# Patient Record
Sex: Male | Born: 1970
Health system: Southern US, Community
[De-identification: ages and names within clinical notes are randomized; demographics above are authoritative.]

## PROBLEM LIST (undated history)

## (undated) DIAGNOSIS — F419 Anxiety disorder, unspecified: Secondary | ICD-10-CM

## (undated) DIAGNOSIS — J45909 Unspecified asthma, uncomplicated: Secondary | ICD-10-CM

## (undated) DIAGNOSIS — I10 Essential (primary) hypertension: Secondary | ICD-10-CM

## (undated) DIAGNOSIS — M1990 Unspecified osteoarthritis, unspecified site: Secondary | ICD-10-CM

## (undated) DIAGNOSIS — E785 Hyperlipidemia, unspecified: Secondary | ICD-10-CM

## (undated) DIAGNOSIS — K219 Gastro-esophageal reflux disease without esophagitis: Secondary | ICD-10-CM

## (undated) DIAGNOSIS — K649 Unspecified hemorrhoids: Secondary | ICD-10-CM

## (undated) DIAGNOSIS — Z87442 Personal history of urinary calculi: Secondary | ICD-10-CM

## (undated) DIAGNOSIS — M109 Gout, unspecified: Secondary | ICD-10-CM

## (undated) DIAGNOSIS — E119 Type 2 diabetes mellitus without complications: Secondary | ICD-10-CM

## (undated) HISTORY — DX: Anxiety disorder, unspecified: F41.9

## (undated) HISTORY — DX: Unspecified osteoarthritis, unspecified site: M19.90

## (undated) HISTORY — DX: Hyperlipidemia, unspecified: E78.5

## (undated) HISTORY — DX: Unspecified hemorrhoids: K64.9

## (undated) HISTORY — PX: HERNIA REPAIR: SHX51

---

## 2000-02-28 ENCOUNTER — Ambulatory Visit (HOSPITAL_COMMUNITY): Admission: RE | Admit: 2000-02-28 | Discharge: 2000-02-28 | Payer: Self-pay | Admitting: Orthopedic Surgery

## 2000-02-28 ENCOUNTER — Encounter: Payer: Self-pay | Admitting: Orthopedic Surgery

## 2008-08-05 HISTORY — PX: COLONOSCOPY: SHX174

## 2014-05-17 DIAGNOSIS — M7022 Olecranon bursitis, left elbow: Secondary | ICD-10-CM | POA: Insufficient documentation

## 2014-05-17 HISTORY — DX: Olecranon bursitis, left elbow: M70.22

## 2015-08-10 HISTORY — PX: COLONOSCOPY: SHX174

## 2017-04-29 DIAGNOSIS — N200 Calculus of kidney: Secondary | ICD-10-CM

## 2017-04-29 HISTORY — DX: Calculus of kidney: N20.0

## 2017-11-27 DIAGNOSIS — N201 Calculus of ureter: Secondary | ICD-10-CM | POA: Insufficient documentation

## 2017-11-27 HISTORY — DX: Calculus of ureter: N20.1

## 2018-01-23 DIAGNOSIS — M659 Synovitis and tenosynovitis, unspecified: Secondary | ICD-10-CM

## 2018-01-23 DIAGNOSIS — M65929 Unspecified synovitis and tenosynovitis, unspecified upper arm: Secondary | ICD-10-CM | POA: Insufficient documentation

## 2018-01-23 HISTORY — DX: Unspecified synovitis and tenosynovitis, unspecified upper arm: M65.929

## 2018-03-09 DIAGNOSIS — M10022 Idiopathic gout, left elbow: Secondary | ICD-10-CM

## 2018-03-09 HISTORY — DX: Idiopathic gout, left elbow: M10.022

## 2019-03-22 DIAGNOSIS — Z8616 Personal history of COVID-19: Secondary | ICD-10-CM | POA: Insufficient documentation

## 2019-03-22 HISTORY — DX: Personal history of COVID-19: Z86.16

## 2019-03-28 ENCOUNTER — Emergency Department (HOSPITAL_BASED_OUTPATIENT_CLINIC_OR_DEPARTMENT_OTHER)
Admission: EM | Admit: 2019-03-28 | Discharge: 2019-03-28 | Disposition: A | Discharge status: Home / Self Care | Payer: 59 | Attending: Emergency Medicine | Admitting: Emergency Medicine

## 2019-03-28 ENCOUNTER — Other Ambulatory Visit: Payer: Self-pay

## 2019-03-28 ENCOUNTER — Emergency Department (HOSPITAL_BASED_OUTPATIENT_CLINIC_OR_DEPARTMENT_OTHER): Payer: 59

## 2019-03-28 ENCOUNTER — Encounter (HOSPITAL_BASED_OUTPATIENT_CLINIC_OR_DEPARTMENT_OTHER): Payer: Self-pay | Admitting: *Deleted

## 2019-03-28 DIAGNOSIS — J9801 Acute bronchospasm: Secondary | ICD-10-CM | POA: Diagnosis not present

## 2019-03-28 DIAGNOSIS — E119 Type 2 diabetes mellitus without complications: Secondary | ICD-10-CM | POA: Insufficient documentation

## 2019-03-28 DIAGNOSIS — I10 Essential (primary) hypertension: Secondary | ICD-10-CM | POA: Diagnosis not present

## 2019-03-28 DIAGNOSIS — Z79899 Other long term (current) drug therapy: Secondary | ICD-10-CM | POA: Insufficient documentation

## 2019-03-28 DIAGNOSIS — U071 COVID-19: Secondary | ICD-10-CM | POA: Diagnosis present

## 2019-03-28 DIAGNOSIS — Z7982 Long term (current) use of aspirin: Secondary | ICD-10-CM | POA: Diagnosis not present

## 2019-03-28 HISTORY — DX: Unspecified asthma, uncomplicated: J45.909

## 2019-03-28 HISTORY — DX: Essential (primary) hypertension: I10

## 2019-03-28 HISTORY — DX: Type 2 diabetes mellitus without complications: E11.9

## 2019-03-28 LAB — CBC WITH DIFFERENTIAL/PLATELET
Abs Immature Granulocytes: 0.03 10*3/uL (ref 0.00–0.07)
Basophils Absolute: 0 10*3/uL (ref 0.0–0.1)
Basophils Relative: 0 %
Eosinophils Absolute: 0.1 10*3/uL (ref 0.0–0.5)
Eosinophils Relative: 1 %
HCT: 35.5 % — ABNORMAL LOW (ref 39.0–52.0)
Hemoglobin: 11.3 g/dL — ABNORMAL LOW (ref 13.0–17.0)
Immature Granulocytes: 0 %
Lymphocytes Relative: 14 %
Lymphs Abs: 1 10*3/uL (ref 0.7–4.0)
MCH: 27.7 pg (ref 26.0–34.0)
MCHC: 31.8 g/dL (ref 30.0–36.0)
MCV: 87 fL (ref 80.0–100.0)
Monocytes Absolute: 0.3 10*3/uL (ref 0.1–1.0)
Monocytes Relative: 5 %
Neutro Abs: 5.6 10*3/uL (ref 1.7–7.7)
Neutrophils Relative %: 80 %
Platelets: 385 10*3/uL (ref 150–400)
RBC: 4.08 MIL/uL — ABNORMAL LOW (ref 4.22–5.81)
RDW: 12.5 % (ref 11.5–15.5)
WBC: 7 10*3/uL (ref 4.0–10.5)
nRBC: 0 % (ref 0.0–0.2)

## 2019-03-28 LAB — BASIC METABOLIC PANEL
Anion gap: 9 (ref 5–15)
BUN: 24 mg/dL — ABNORMAL HIGH (ref 6–20)
CO2: 23 mmol/L (ref 22–32)
Calcium: 8.9 mg/dL (ref 8.9–10.3)
Chloride: 105 mmol/L (ref 98–111)
Creatinine, Ser: 1.15 mg/dL (ref 0.61–1.24)
GFR calc Af Amer: >60 mL/min (ref >60)
GFR calc non Af Amer: >60 mL/min (ref >60)
Glucose, Bld: 240 mg/dL — ABNORMAL HIGH (ref 70–99)
Potassium: 4.6 mmol/L (ref 3.5–5.1)
Sodium: 137 mmol/L (ref 135–145)

## 2019-03-28 LAB — CBG MONITORING, ED: Glucose-Capillary: 252 mg/dL — ABNORMAL HIGH (ref 70–99)

## 2019-03-28 MED ORDER — BENZONATATE 100 MG PO CAPS: 100.0000 mg | ORAL_CAPSULE | Freq: Three times a day (TID) | ORAL | 0 refills | Status: DC | PRN

## 2019-03-28 MED ORDER — SODIUM CHLORIDE 0.9 % IV BOLUS
500.0000 mL | Freq: Once | INTRAVENOUS | Status: AC
  Administered 2019-03-28: 500 mL via INTRAVENOUS

## 2019-03-28 MED ORDER — ALBUTEROL SULFATE HFA 108 (90 BASE) MCG/ACT IN AERS
2.0000 | INHALATION_SPRAY | Freq: Once | RESPIRATORY_TRACT | Status: AC
  Administered 2019-03-28: 2 via RESPIRATORY_TRACT
  Filled 2019-03-28: qty 6.7

## 2019-03-28 MED ORDER — AEROCHAMBER PLUS FLO-VU MEDIUM MISC
1.0000 | Freq: Once | Status: AC
  Administered 2019-03-28: 1
  Filled 2019-03-28: qty 1

## 2019-03-28 NOTE — ED Notes (Signed)
Pt ambulated in room, pulse O2 maintained at 97-99% while ambulating.

## 2019-03-28 NOTE — ED Provider Notes (Signed)
[facility] [facility] EMERGENCY DEPARTMENT Provider Note   CSN: [id number] Arrival date & time: 03/28/19  1343     History Chief Complaint  Patient presents with  . Shortness of Breath    Covid +    Joshua Skinner is a 48 y.o. male who presents emergency department with chief complaint of shortness of breath.  The patient tested positive for coronavirus on 03/22/2019.  He has been symptomatic since that had.  His mother is also positive for coronavirus.  He states that during the day his oxygen saturation is only in the low 90s however at night he desaturates into the 80s and has to be on at least 2 L of oxygen to keep his oxygen saturations high.  He has been using his mother's oxygen tank.  He states he has constant coughing, weight loss, poor appetite, headaches, body aches and chills.  He denies vomiting or diarrhea.  Patient denies orthopnea, PND or history of obstructive sleep apnea.  HPI     Past Medical History:  Diagnosis Date  . Asthma   . Diabetes mellitus without complication (HCC)   . Hypertension     There are no problems to display for this patient.   Past Surgical History:  Procedure Laterality Date  . HERNIA REPAIR         No family history on file.  Social History   Tobacco Use  . Smoking status: Never Smoker  . Smokeless tobacco: Never Used  Substance Use Topics  . Alcohol use: Never  . Drug use: Never    Home Medications Prior to Admission medications   Medication Sig Start Date End Date Taking? Authorizing Provider  aspirin 81 MG chewable tablet Chew 81 mg by mouth daily.   Yes [provider]  allopurinol (ZYLOPRIM) 300 MG tablet Take 300 mg by mouth daily. 02/08/19   [provider]  atorvastatin (LIPITOR) 40 MG tablet Take 40 mg by mouth daily. 03/04/19   [provider]  benzonatate (TESSALON PERLES) 100 MG capsule Take 1 capsule (100 mg total) by mouth 3 (three) times daily as needed for cough (cough). 03/28/19    Ilisa Hayworth, Cammy Copa, PA-C  lisinopril (ZESTRIL) 20 MG tablet Take 20 mg by mouth daily. 03/27/19   [provider]  metFORMIN (GLUCOPHAGE) 1000 MG tablet Take 1,000 mg by mouth 2 (two) times daily. 03/27/19   [provider]  tamsulosin (FLOMAX) 0.4 MG CAPS capsule Take 0.4 mg by mouth at bedtime. 12/05/18   [provider]    Allergies    Patient has no known allergies.  Review of Systems   Review of Systems Ten systems reviewed and are negative for acute change, except as noted in the HPI.   Physical Exam Updated Vital Signs BP (!) 145/98 (BP Location: Right Arm)   Pulse 90   Temp 97.8 F (36.6 C) (Oral)   Resp 18   Ht 5\' 7"  (1.702 m)   Wt 86.2 kg   SpO2 98%   BMI 29.76 kg/m   Physical Exam Vitals and nursing note reviewed.  Constitutional:      General: He is not in acute distress.    Appearance: He is well-developed. He is not diaphoretic.  HENT:     Head: Normocephalic and atraumatic.  Eyes:     General: No scleral icterus.    Conjunctiva/sclera: Conjunctivae normal.  Cardiovascular:     Rate and Rhythm: Normal rate and regular rhythm.     Heart sounds: Normal heart  sounds.  Pulmonary:     Effort: Pulmonary effort is normal. No respiratory distress.     Breath sounds: Wheezing present.  Abdominal:     Palpations: Abdomen is soft.     Tenderness: There is no abdominal tenderness.  Musculoskeletal:     Cervical back: Normal range of motion and neck supple.  Skin:    General: Skin is warm and dry.  Neurological:     Mental Status: He is alert.  Psychiatric:        Behavior: Behavior normal.     ED Results / Procedures / Treatments   Labs (all labs ordered are listed, but only abnormal results are displayed) Labs Reviewed  BASIC METABOLIC PANEL - Abnormal; Notable for the following components:      Result Value   Glucose, Bld 240 (*)    BUN 24 (*)    All other components within normal limits  CBC WITH DIFFERENTIAL/PLATELET -  Abnormal; Notable for the following components:   RBC 4.08 (*)    Hemoglobin 11.3 (*)    HCT 35.5 (*)    All other components within normal limits  CBG MONITORING, ED - Abnormal; Notable for the following components:   Glucose-Capillary 252 (*)    All other components within normal limits    EKG None  Radiology DG Chest Port 1 View  Result Date: 03/28/2019 CLINICAL DATA:  Cough with shortness of breath. Positive COVID-19 test 03/22/2019. Abdominal pain. Hyperglycemia on prednisone. History of diabetes. EXAM: PORTABLE CHEST 1 VIEW COMPARISON:  None. FINDINGS: 1614 hours. The heart size and mediastinal contours are normal. Small nodular density projects over the right 5th anterior rib space, possibly an asymmetric nipple shadow. Small pulmonary nodule difficult to exclude. The lungs are otherwise clear. There is no confluent airspace opacity, edema, pleural effusion or pneumothorax. The bones appear unremarkable. IMPRESSION: 1. No acute cardiopulmonary process. 2. Possible small right basilar pulmonary nodule versus asymmetric nipple shadow. Suggest repeat PA view with nipple markers (unless CT is planned for other reasons). Electronically Signed   By: Carey BullocksWilliam  Veazey M.D.   On: 03/28/2019 16:34    Procedures Procedures (including critical care time)  Medications Ordered in ED Medications  AeroChamber Plus Flo-Vu Medium MISC 1 each (1 each Other Given 03/28/19 1649)  albuterol (VENTOLIN HFA) 108 (90 Base) MCG/ACT inhaler 2 puff (2 puffs Inhalation Given 03/28/19 1649)  sodium chloride 0.9 % bolus 500 mL ( Intravenous Stopped 03/28/19 1710)    ED Course  I have reviewed the triage vital signs and the nursing notes.  Pertinent labs & imaging results that were available during my care of the patient were reviewed by me and considered in my medical decision making (see chart for details).    MDM Rules/Calculators/A&P                      48 y/o male with known Covid-19.  Patient is  complaining of worsening shortness of breath and hypoxia at night on home monitor.  I have reviewed the patient's lab which shows elevated blood glucose, mildly elevated BUN of insignificant value.  He has slightly low hemoglobin in the setting of poor oral intake and COVID-19.  I personally reviewed the patient's chest x-ray which shows no evidence of infiltrates on my interpretation.  Patient was given albuterol inhaler with improvement in his cough.  He is currently on oral steroids.  He had no reproducible hypoxia at any point during the visit.  He was  ambulated without hypoxia here in the emergency department.  Feel that this is likely sensation of bronchospasm and he will be treated with albuterol.  Perhaps there is some poor calibration on the patient's pulse ox at home.  Advised patient that he should return for any severe worsening in his shortness of breath.  Patient appears appropriate for discharge at this time with COVID-19 home monitoring.   Final Clinical Impression(s) / ED Diagnoses Final diagnoses:  COVID-19 virus infection  Bronchospasm    Rx / DC Orders ED Discharge Orders         Ordered    benzonatate (TESSALON PERLES) 100 MG capsule  3 times daily PRN,   Status:  Discontinued     03/28/19 1747    benzonatate (TESSALON PERLES) 100 MG capsule  3 times daily PRN     03/28/19 1748           Margarita Mail, PA-C 03/28/19 2244    Long, W[facility], MD 03/29/19 1311

## 2019-03-28 NOTE — ED Triage Notes (Signed)
Pt reports Covid Sx since 12/10. He had a + covid test on 12/21. He reports his home pulse oximeter was in the 80s last night. He states he had to use his mother's oxygen tank last night to get his sats to 95%. C/o abd pain and feeling cold/hot. He is on prednisone and states his sugar has been running in the 200-300s

## 2019-03-28 NOTE — Discharge Instructions (Addendum)
We were unable to document any hypoxia here in the ER. It is possible that your pulse is not correctly calibrated and is reading low. However, you may need to contact your primary care doctor to order supplemental oxygen. You do not have any evidence of pnuemonia on your xray. There was a small questionable pulmonary nodule vs. A nipple shadow on the film. You will need to get a repeat chest xray in 3 months to reevaluate and I have given you a hand out to explain this diagnosis. You also appear to be having some bronchospasm- a form of reactive asthma- which is likely why you are feeling sob. Please use the inhaler (1-2 puffs every 4-6 hours) to help reduce your symptoms. I am prescribing an cough suppressant. Return for any new or worsening symptoms.

## 2019-06-25 HISTORY — PX: ESOPHAGOGASTRODUODENOSCOPY: SHX1529

## 2020-01-28 ENCOUNTER — Other Ambulatory Visit: Payer: Self-pay

## 2020-01-28 ENCOUNTER — Encounter: Payer: Self-pay | Admitting: Medical

## 2020-01-28 ENCOUNTER — Ambulatory Visit (INDEPENDENT_AMBULATORY_CARE_PROVIDER_SITE_OTHER): Payer: 59 | Admitting: Medical

## 2020-01-28 VITALS — BP 165/90 | HR 86 | Resp 18 | Ht 67.0 in | Wt 209.0 lb

## 2020-01-28 DIAGNOSIS — I10 Essential (primary) hypertension: Secondary | ICD-10-CM | POA: Diagnosis not present

## 2020-01-28 DIAGNOSIS — M109 Gout, unspecified: Secondary | ICD-10-CM | POA: Diagnosis not present

## 2020-01-28 DIAGNOSIS — E785 Hyperlipidemia, unspecified: Secondary | ICD-10-CM

## 2020-01-28 DIAGNOSIS — J029 Acute pharyngitis, unspecified: Secondary | ICD-10-CM

## 2020-01-28 DIAGNOSIS — E119 Type 2 diabetes mellitus without complications: Secondary | ICD-10-CM

## 2020-01-28 DIAGNOSIS — Z125 Encounter for screening for malignant neoplasm of prostate: Secondary | ICD-10-CM

## 2020-01-28 DIAGNOSIS — R350 Frequency of micturition: Secondary | ICD-10-CM

## 2020-01-28 DIAGNOSIS — N401 Enlarged prostate with lower urinary tract symptoms: Secondary | ICD-10-CM

## 2020-01-28 LAB — POCT RAPID STREP A (OFFICE): Rapid Strep A Screen: NEGATIVE

## 2020-01-28 MED ORDER — AMLODIPINE BESYLATE 5 MG PO TABS: 5.0000 mg | ORAL_TABLET | Freq: Every day | ORAL | 3 refills | Status: DC

## 2020-01-28 MED ORDER — METFORMIN HCL 1000 MG PO TABS: 1000.0000 mg | ORAL_TABLET | Freq: Two times a day (BID) | ORAL | 11 refills | Status: DC

## 2020-01-28 MED ORDER — FLUTICASONE PROPIONATE 50 MCG/ACT NA SUSP: 2.0000 | Freq: Every day | NASAL | 1 refills | Status: DC

## 2020-01-28 MED ORDER — AZITHROMYCIN 250 MG PO TABS: ORAL_TABLET | ORAL | 0 refills | Status: DC

## 2020-01-28 NOTE — Patient Instructions (Signed)
You do have history of hypertension and your blood pressure is high today and high when you check it as well. Continue lisinopril and will add amlodipine 5 mg daily.  History of hyperlipidemia. Will check metabolic panel and lipid panel today. We will see if he needs adjustment on lipid management.  History of diabetes with described A1c in the 7 range on last check. Also you daily blood sugar readings found on the high side. Continue Metformin and will follow A1c result today. May need to add additional medication. Also want to make you aware that we do have endocrinologist in our office.  For history of gout will get uric acid today.  For BPH continue Flomax. Also ordered screening PSA today.  Sore throat for the last 4 days. Moderate redness on exam and slightly enlarged tender lymph node in submandibular area. Rapid strep was negative but decided to go ahead and get a azithromycin 5-day antibiotic. Also recent mild nasal congestion so prescribed Flonase and event some of sore throat postnasal drainage related to allergies.  Follow-up in 2 weeks for blood pressure check or as needed.

## 2020-01-28 NOTE — Progress Notes (Signed)
Subjective:    Patient ID: Joshua Skinner, male    DOB: [birth year], 49 y.o.   MRN: [id number]  HPI  Pt in for first time.  Pt works as Naval architect. Does not exercise. Admits not best diet. Coca cola about once a day. Married.  Pt has htn, high cholesterol and is diabetic.  Pt has htn and is on lisinopril  For high cholesterol and is on atorvastatin.  For diabetes he is on metformin.(ozempic and trulicity gave him diarrhea)  Pt has bph. He is on flomax.  Pt declined flu vaccine.  Also has st since Monday. Has pnd. Pain on swollowing.    Review of Systems  Constitutional: Negative for chills and fatigue.  HENT: Positive for postnasal drip and sore throat. Negative for sinus pain.   Respiratory: Negative for cough, chest tightness, shortness of breath and wheezing.   Cardiovascular: Negative for chest pain and palpitations.  Gastrointestinal: Negative for abdominal pain.  Genitourinary: Negative for dysuria.  Musculoskeletal: Negative for back pain and myalgias.  Skin: Negative for rash.  Neurological: Negative for dizziness and headaches.  Hematological: Negative for adenopathy.  Psychiatric/Behavioral: Negative for behavioral problems and sleep disturbance. The patient is not nervous/anxious.      Past Medical History:  Diagnosis Date  . Asthma   . Diabetes mellitus without complication (HCC)   . Hypertension      Social History   Socioeconomic History  . Marital status: Married    Spouse name: Not on file  . Number of children: Not on file  . Years of education: Not on file  . Highest education level: Not on file  Occupational History  . Not on file  Tobacco Use  . Smoking status: Never Smoker  . Smokeless tobacco: Never Used  Vaping Use  . Vaping Use: Never used  Substance and Sexual Activity  . Alcohol use: Never  . Drug use: Never  . Sexual activity: Not on file  Other Topics Concern  . Not on file  Social History Narrative  . Not on file    Social Determinants of Health   Financial Resource Strain:   . Difficulty of Paying Living Expenses: Not on file  Food Insecurity:   . Worried About Programme researcher, broadcasting/film/video in the Last Year: Not on file  . Ran Out of Food in the Last Year: Not on file  Transportation Needs:   . Lack of Transportation (Medical): Not on file  . Lack of Transportation (Non-Medical): Not on file  Physical Activity:   . Days of Exercise per Week: Not on file  . Minutes of Exercise per Session: Not on file  Stress:   . Feeling of Stress : Not on file  Social Connections:   . Frequency of Communication with Friends and Family: Not on file  . Frequency of Social Gatherings with Friends and Family: Not on file  . Attends Religious Services: Not on file  . Active Member of Clubs or Organizations: Not on file  . Attends Banker Meetings: Not on file  . Marital Status: Not on file  Intimate Partner Violence:   . Fear of Current or Ex-Partner: Not on file  . Emotionally Abused: Not on file  . Physically Abused: Not on file  . Sexually Abused: Not on file    Past Surgical History:  Procedure Laterality Date  . HERNIA REPAIR      No family history on file.  No Known Allergies  Current Outpatient Medications  on File Prior to Visit  Medication Sig Dispense Refill  . allopurinol (ZYLOPRIM) 300 MG tablet Take 300 mg by mouth daily.    Marland Kitchen aspirin 81 MG chewable tablet Chew 81 mg by mouth daily.    Marland Kitchen atorvastatin (LIPITOR) 40 MG tablet Take 40 mg by mouth daily.    Marland Kitchen lisinopril (ZESTRIL) 20 MG tablet Take 20 mg by mouth daily.    . metFORMIN (GLUCOPHAGE) 1000 MG tablet Take 1,000 mg by mouth 2 (two) times daily.    . tamsulosin (FLOMAX) 0.4 MG CAPS capsule Take 0.4 mg by mouth at bedtime.    . benzonatate (TESSALON PERLES) 100 MG capsule Take 1 capsule (100 mg total) by mouth 3 (three) times daily as needed for cough (cough). (Patient not taking: Reported on 01/28/2020) 20 capsule 0   No  current facility-administered medications on file prior to visit.    BP (!) 164/94   Pulse 86   Resp 18   Ht 5\' 7"  (1.702 m)   Wt 209 lb (94.8 kg)   SpO2 98%   BMI 32.73 kg/m       Objective:   Physical Exam  General Mental Status- Alert. General Appearance- Not in acute distress.   Skin General: Color- Normal Color. Moisture- Normal Moisture.  Neck Carotid Arteries- Normal color. Moisture- Normal Moisture. No carotid bruits. No JVD.  Chest and Lung Exam Auscultation: Breath Sounds:-Normal.  Cardiovascular Auscultation:Rythm- Regular. Murmurs & Other Heart Sounds:Auscultation of the heart reveals- No Murmurs.  Abdomen Inspection:-Inspeection Normal. Palpation/Percussion:Note:No mass. Palpation and Percussion of the abdomen reveal- Non Tender, Non Distended + BS, no rebound or guarding.    Neurologic Cranial Nerve exam:- CN III-XII intact(No nystagmus), symmetric smile. Drift Test:- No drift. Romberg Exam:- Negative.  Heal to Toe Gait exam:-Normal. Finger to Nose:- Normal/Intact Strength:- 5/5 equal and symmetric strength both upper and lower extremities.      Assessment & Plan:  You do have history of hypertension and your blood pressure is high today and high when you check it as well. Continue lisinopril and will add amlodipine 5 mg daily.  History of hyperlipidemia. Will check metabolic panel and lipid panel today. We will see if he needs adjustment on lipid management.  History of diabetes with described A1c in the 7 range on last check. Also you daily blood sugar readings found on the high side. Continue Metformin and will follow A1c result today. May need to add additional medication. Also want to make you aware that we do have endocrinologist in our office.  For history of gout will get uric acid today.  For BPH continue Flomax. Also ordered screening PSA today.  Sore throat for the last 4 days. Moderate redness on exam and slightly enlarged tender  lymph node in submandibular area. Rapid strep was negative but decided to go ahead and get a azithromycin 5-day antibiotic. Also recent mild nasal congestion so prescribed Flonase and event some of sore throat postnasal drainage related to allergies.  Follow-up in 2 weeks for blood pressure check or as needed.  , PA-C

## 2020-01-29 ENCOUNTER — Telehealth: Payer: Self-pay | Admitting: Medical

## 2020-01-29 LAB — LIPID PANEL
Cholesterol: 116 mg/dL (ref <200)
HDL: 34 mg/dL — ABNORMAL LOW (ref >=40)
LDL Cholesterol (Calc): 42 mg/dL (calc)
Non-HDL Cholesterol (Calc): 82 mg/dL (calc) (ref <130)
Total CHOL/HDL Ratio: 3.4 (calc) (ref <5.0)
Triglycerides: 380 mg/dL — ABNORMAL HIGH (ref <150)

## 2020-01-29 LAB — COMPREHENSIVE METABOLIC PANEL
AG Ratio: 2.1 (calc) (ref 1.0–2.5)
ALT: 24 U/L (ref 9–46)
AST: 16 U/L (ref 10–40)
Albumin: 4.9 g/dL (ref 3.6–5.1)
Alkaline phosphatase (APISO): 46 U/L (ref 36–130)
BUN: 19 mg/dL (ref 7–25)
CO2: 24 mmol/L (ref 20–32)
Calcium: 9.7 mg/dL (ref 8.6–10.3)
Chloride: 103 mmol/L (ref 98–110)
Creat: 1.13 mg/dL (ref 0.60–1.35)
Globulin: 2.3 g/dL (calc) (ref 1.9–3.7)
Glucose, Bld: 122 mg/dL — ABNORMAL HIGH (ref 65–99)
Potassium: 4.2 mmol/L (ref 3.5–5.3)
Sodium: 140 mmol/L (ref 135–146)
Total Bilirubin: 0.5 mg/dL (ref 0.2–1.2)
Total Protein: 7.2 g/dL (ref 6.1–8.1)

## 2020-01-29 LAB — URIC ACID: Uric Acid, Serum: 5.7 mg/dL (ref 4.0–8.0)

## 2020-01-29 LAB — PSA: PSA: 0.41 ng/mL (ref <=4.0)

## 2020-01-29 LAB — HEMOGLOBIN A1C
Hgb A1c MFr Bld: 8.1 % of total Hgb — ABNORMAL HIGH (ref <5.7)
Mean Plasma Glucose: 186 (calc)
eAG (mmol/L): 10.3 (calc)

## 2020-01-29 MED ORDER — SITAGLIPTIN PHOSPHATE 50 MG PO TABS: 50.0000 mg | ORAL_TABLET | Freq: Every day | ORAL | 3 refills | Status: DC

## 2020-01-29 NOTE — Telephone Encounter (Signed)
Rx januvia sent to pt pharmacy. 

## 2020-02-11 ENCOUNTER — Ambulatory Visit: Payer: Self-pay | Admitting: Medical

## 2020-03-20 ENCOUNTER — Telehealth: Payer: Self-pay | Admitting: Medical

## 2020-03-20 MED ORDER — LISINOPRIL 20 MG PO TABS: 20.0000 mg | ORAL_TABLET | Freq: Every day | ORAL | 2 refills | Status: DC

## 2020-03-20 NOTE — Telephone Encounter (Signed)
Rx sent 

## 2020-03-20 NOTE — Telephone Encounter (Signed)
atorvastatin (LIPITOR) 40 MG tablet [[id number]]   [id number] - [place], [place] - [address]  [address], [place] [address]  Phone:  [phone] Fax:  [phone]

## 2020-04-10 ENCOUNTER — Ambulatory Visit: Payer: Self-pay | Admitting: Medical

## 2020-04-10 ENCOUNTER — Other Ambulatory Visit: Payer: Self-pay

## 2020-04-10 ENCOUNTER — Ambulatory Visit (INDEPENDENT_AMBULATORY_CARE_PROVIDER_SITE_OTHER): Payer: 59 | Admitting: Medical

## 2020-04-10 VITALS — BP 128/78 | HR 66 | Resp 18 | Ht 67.0 in | Wt 208.0 lb

## 2020-04-10 DIAGNOSIS — E119 Type 2 diabetes mellitus without complications: Secondary | ICD-10-CM

## 2020-04-10 DIAGNOSIS — R0789 Other chest pain: Secondary | ICD-10-CM | POA: Diagnosis not present

## 2020-04-10 DIAGNOSIS — I10 Essential (primary) hypertension: Secondary | ICD-10-CM | POA: Diagnosis not present

## 2020-04-10 DIAGNOSIS — E785 Hyperlipidemia, unspecified: Secondary | ICD-10-CM | POA: Diagnosis not present

## 2020-04-10 MED ORDER — ATORVASTATIN CALCIUM 40 MG PO TABS: 40.0000 mg | ORAL_TABLET | Freq: Every day | ORAL | 1 refills | Status: DC

## 2020-04-10 NOTE — Patient Instructions (Addendum)
Your blood pressure is well controlled today. Continue amlodipine daily.  Hx of high cholesterol. Continue atorvastatin.  For diabetes continue metformin and januvia. Get a1c.  Try to get daily exercise.  Follow up 3-6 months or as needed.  Future labs will be placed as future.  Will get chest xray and view xyphoid and surrounding areas/bones of ribs

## 2020-04-10 NOTE — Progress Notes (Signed)
Subjective:    Patient ID: Joshua Skinner, male    DOB: 1970-04-27, 50 y.o.   MRN: [id number]  HPI  Pt has htn. His bp is well controlled. No cardiac or neurologic signs or symptoms. He is on norvasc 5 mg daily.  High cholesterol and is on atorvastatin. Ran out over a month ago.   Hx of diabetes. He is on metformin and [place].  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    Review of Systems  Constitutional: Negative for chills, fatigue and fever.  Respiratory: Negative for cough, chest tightness, shortness of breath and wheezing.   Cardiovascular: Negative for chest pain and palpitations.  Gastrointestinal: Negative for abdominal pain, constipation, diarrhea, nausea and vomiting.  Genitourinary: Negative for dysuria, flank pain and frequency.  Musculoskeletal: Negative for back pain and myalgias.  Skin: Negative for rash.  Neurological: Negative for dizziness, speech difficulty, weakness and headaches.  Hematological: Negative for adenopathy. Does not bruise/bleed easily.  Psychiatric/Behavioral: Negative for behavioral problems, confusion, hallucinations and suicidal ideas. The patient is not nervous/anxious.      Past Medical History:  Diagnosis Date  . Asthma   . Diabetes mellitus without complication (HCC)   . Hyperlipidemia   . Hypertension      Social History   Socioeconomic History  . Marital status: Married    Spouse name: Not on file  . Number of children: Not on file  . Years of education: Not on file  . Highest education level: Not on file  Occupational History  . Not on file  Tobacco Use  . Smoking status: Never Smoker  . Smokeless tobacco: Never Used  Vaping Use  . Vaping Use: Never used  Substance and Sexual Activity  . Alcohol use: Never  . Drug use: Never  . Sexual activity: Yes  Other Topics Concern  . Not on file  Social History Narrative  . Not on file    Social Determinants of Health   Financial Resource Strain: Not on file  Food Insecurity: Not on file  Transportation Needs: Not on file  Physical Activity: Not on file  Stress: Not on file  Social Connections: Not on file  Intimate Partner Violence: Not on file    Past Surgical History:  Procedure Laterality Date  . HERNIA REPAIR      No family history on file.  No Known Allergies  Current Outpatient Medications on File Prior to Visit  Medication Sig Dispense Refill  . allopurinol (ZYLOPRIM) 300 MG tablet Take 300 mg by mouth daily.    Marland Kitchen amLODipine (NORVASC) 5 MG tablet Take 1 tablet (5 mg total) by mouth daily. 30 tablet 3  . aspirin 81 MG chewable tablet Chew 81 mg by mouth daily.    Marland Kitchen atorvastatin (LIPITOR) 40 MG tablet Take 40 mg by mouth daily.    Marland Kitchen azithromycin (ZITHROMAX) 250 MG tablet Take 2 tablets by mouth on day 1, followed by 1 tablet by mouth daily for 4 days. 6 tablet 0  . benzonatate (TESSALON PERLES) 100 MG capsule Take 1 capsule (100 mg total) by mouth 3 (three) times daily as needed for cough (cough). 20 capsule 0  . fluticasone (FLONASE) 50 MCG/ACT nasal spray Place 2 sprays into both nostrils daily. 16 g 1  . lisinopril (ZESTRIL) 20 MG tablet Take 1 tablet (20 mg total) by mouth daily. 30 tablet 2  . metFORMIN (GLUCOPHAGE)  1000 MG tablet Take 1 tablet (1,000 mg total) by mouth 2 (two) times daily. 60 tablet 11  . sitaGLIPtin (JANUVIA) 50 MG tablet Take 1 tablet (50 mg total) by mouth daily. 30 tablet 3  . tamsulosin (FLOMAX) 0.4 MG CAPS capsule Take 0.4 mg by mouth at bedtime.     No current facility-administered medications on file prior to visit.    BP 128/78   Pulse 66   Resp 18   Ht 5\' 7"  (1.702 m)   Wt 208 lb (94.3 kg)   SpO2 98%   BMI 32.58 kg/m       Objective:   Physical Exam  General Mental Status- Alert. General Appearance- Not in acute distress.   Skin General: Color- Normal Color. Moisture- Normal Moisture.  Neck Carotid  Arteries- Normal color. Moisture- Normal Moisture. No carotid bruits. No JVD.  Chest and Lung Exam Auscultation: Breath Sounds:-Normal.  Cardiovascular Auscultation:Rythm- Regular. Murmurs & Other Heart Sounds:Auscultation of the heart reveals- No Murmurs.  Abdomen Inspection:-Inspeection Normal. Palpation/Percussion:Note:No mass. Palpation and Percussion of the abdomen reveal- Non Tender, Non Distended + BS, no rebound or guarding.   Neurologic Cranial Nerve exam:- CN III-XII intact(No nystagmus), symmetric smile. Strength:- 5/5 equal and symmetric strength both upper and lower extremities.      Assessment & Plan:  Your blood pressure is well controlled today. Continue amlodipine daily.  Hx of high cholesterol. Continue atorvastatin.  For diabetes continue metformin and januvia. Get a1c.  Try to get daily exercise.  Follow up 3-6 months or as needed.  Future labs will be placed as future.

## 2020-05-09 ENCOUNTER — Other Ambulatory Visit: Payer: Self-pay

## 2020-05-09 ENCOUNTER — Telehealth: Payer: Self-pay

## 2020-05-09 ENCOUNTER — Telehealth (INDEPENDENT_AMBULATORY_CARE_PROVIDER_SITE_OTHER): Payer: 59 | Admitting: Medical

## 2020-05-09 VITALS — BP 131/93 | HR 101

## 2020-05-09 DIAGNOSIS — U071 COVID-19: Secondary | ICD-10-CM | POA: Diagnosis not present

## 2020-05-09 DIAGNOSIS — R059 Cough, unspecified: Secondary | ICD-10-CM | POA: Diagnosis not present

## 2020-05-09 DIAGNOSIS — R3911 Hesitancy of micturition: Secondary | ICD-10-CM

## 2020-05-09 DIAGNOSIS — R0981 Nasal congestion: Secondary | ICD-10-CM

## 2020-05-09 DIAGNOSIS — R0600 Dyspnea, unspecified: Secondary | ICD-10-CM | POA: Diagnosis not present

## 2020-05-09 DIAGNOSIS — E119 Type 2 diabetes mellitus without complications: Secondary | ICD-10-CM

## 2020-05-09 MED ORDER — FLUTICASONE PROPIONATE 50 MCG/ACT NA SUSP: 2.0000 | Freq: Every day | NASAL | 1 refills | Status: DC

## 2020-05-09 MED ORDER — SAXAGLIPTIN HCL 5 MG PO TABS: 5.0000 mg | ORAL_TABLET | Freq: Every day | ORAL | 3 refills | Status: DC

## 2020-05-09 MED ORDER — BUDESONIDE-FORMOTEROL FUMARATE 160-4.5 MCG/ACT IN AERO: 2.0000 | INHALATION_SPRAY | Freq: Two times a day (BID) | RESPIRATORY_TRACT | 0 refills | Status: DC

## 2020-05-09 NOTE — Progress Notes (Signed)
Subjective:    Patient ID: Joshua Skinner, male    DOB: Jun 21, 1970, 50 y.o.   MRN: [id number]  HPI Virtual Visit via Video Note  I connected with Lorayne Marek on 05/09/20 at  1:40 PM EST by a video enabled telemedicine application and verified that I am speaking with the correct person using two identifiers.  Location: Patient: home Provider: office.  Participants- pt and myself.   I discussed the limitations of evaluation and management by telemedicine and the availability of in person appointments. The patient expressed understanding and agreed to proceed.  History of Present Illness:  Pt states tested + for covid on Sunday. Pt symptoms started on Friday. Started feeling achy, fatigued, cough and mild sob. Still feels some winded. Has nasal congestion. Cough is rare presently. No wheezing. Does have nasal congestion.    Pt never vaccinated against covid. He did get covid in the past/ first year. First time was worse. Pt was treated in ED first year of pandemic.   Pt has albterol at home and uses about twice a day.  Pt is on vitamin D, Vit C and zinc.    Patient also mentions today that is Januvia pressures increased dramatically.  This has been the case since early January.  He was noticed cheaper alternative.  In addition he had some slight difficulty urinating that day but I lasted for about a day now urinating normally.  Patient does have Flomax and uses it daily.  Observations/Objective:  General-no acute distress, pleasant, oriented. heent- sounds nasal congested. Lungs- on inspection lungs appear unlabored. Neck- no tracheal deviation or jvd on inspection. Neuro- gross motor function appears intact.  Assessment and Plan: Recent diagnosis of COVID since Friday past week.  Moderate signs and symptoms described.  Symptoms primarily include nasal congestion, sinus pressure fatigue and mild shortness of breath.  Advise patient continue vitamin D, zinc and vitamin  C.  Decided to go ahead and prescribe azithromycin due to sinus pressure and concern for possible bronchitis.  Prescription of Symbicort sent to patient's pharmacy and prescribed Flonase nasal spray for nasal congestion.  Do want patient to get O2 sat monitor over-the-counter and check O2 sats daily.  Update me via MyChart on O2 sat readings.  Depending on how you do over the next couple days might recommend post Covid clinic.  Presently overall patient appears clinically stable.  Unvaccinated but has had Covid first year of pandemic and this will be a second Covid infection.  For diabetes prescription of Onglyza 5 mg sent to patient's pharmacy.  Discontinue Januvia.  Get A1c on follow-up later this month.  Some brief urinary hesitancy over the weekend.  None present today.  Continue low-dose Flomax and let me know if urinary hesitancy returns.  Follow-up in 2 to 3 weeks or as needed.  Follow Up Instructions:    I discussed the assessment and treatment plan with the patient. The patient was provided an opportunity to ask questions and all were answered. The patient agreed with the plan and demonstrated an understanding of the instructions.   The patient was advised to call back or seek an in-person evaluation if the symptoms worsen or if the condition fails to improve as anticipated.  Time spent with patient today was25   minutes which consisted of chart review, discussing diagnoses  treatment and documentation.   Esperanza Richters, PA-C    Review of Systems  Constitutional: Positive for fatigue. Negative for chills and fever.  HENT: Positive for congestion  and sinus pressure. Negative for nosebleeds, postnasal drip, sinus pain and trouble swallowing.   Respiratory: Negative for cough, chest tightness, shortness of breath and wheezing.   Cardiovascular: Negative for chest pain and palpitations.  Gastrointestinal: Negative for abdominal pain, blood in stool, diarrhea and nausea.   Endocrine: Negative for polydipsia and polyuria.  Genitourinary: Negative for decreased urine volume, dysuria, frequency, penile pain and urgency.       Some mild straining to urinate. Taking flomax 0.4 daily. Today he urinating better.  Musculoskeletal: Negative for back pain and joint swelling.  Neurological: Negative for dizziness, seizures, speech difficulty, weakness and headaches.  Hematological: Negative for adenopathy. Does not bruise/bleed easily.       Objective:   Physical Exam        Assessment & Plan:

## 2020-05-09 NOTE — Patient Instructions (Signed)
Recent diagnosis of COVID since Friday past week tested +.  Moderate signs and symptoms described.  Symptoms primarily include nasal congestion, sinus pressure fatigue and mild shortness of breath.  Advise patient continue vitamin D, zinc and vitamin C.  Decided to go ahead and prescribe azithromycin due to sinus pressure and concern for possible bronchitis.  Prescription of Symbicort sent to patient's pharmacy and prescribed Flonase nasal spray for nasal congestion.  Do want patient to get O2 sat monitor over-the-counter and check O2 sats daily.  Update me via MyChart on O2 sat readings.  Depending on how you do over the next couple days might recommend post Covid clinic.  Presently overall patient appears clinically stable.  Unvaccinated but has had Covid first year of pandemic and this will be a second Covid infection.  For diabetes prescription of Onglyza 5 mg sent to patient's pharmacy.  Discontinue Januvia.  Get A1c on follow-up later this month.  Some brief urinary hesitancy over the weekend.  None present today.  Continue low-dose Flomax and let me know if urinary hesitancy returns.  Follow-up in 2 to 3 weeks or as needed.

## 2020-05-09 NOTE — Telephone Encounter (Signed)
PA initiated via Covermymeds; KEY:  [id number]. Awaiting determination.

## 2020-05-10 ENCOUNTER — Telehealth: Payer: Self-pay | Admitting: Medical

## 2020-05-10 DIAGNOSIS — U071 COVID-19: Secondary | ICD-10-CM

## 2020-05-10 NOTE — Telephone Encounter (Signed)
PA approved.   The request has been approved. The authorization is effective from 05/09/2020 to 05/08/2021, as long as the member is enrolled in their current health plan. The request was reviewed and approved by a licensed clinical pharmacist. A written notification letter will follow with additional details.

## 2020-05-10 NOTE — Telephone Encounter (Signed)
Appt scheduled for 2/10.

## 2020-05-10 NOTE — Telephone Encounter (Signed)
Will you fill out the amb covid treatment order/arrange pt to be seen tomorrow or Friday.   On addiitonal information can explain hx of diabetes and about 50% f time his 02 is 92%.

## 2020-05-10 NOTE — Telephone Encounter (Signed)
Covid clinic wants to know if patient is getting covid treatment as in infusion or just an appointment ?

## 2020-05-10 NOTE — Telephone Encounter (Signed)
Caller: Marylu Lund ( post covid Clinic) Call back # [phone]  Would like a call back

## 2020-05-10 NOTE — Telephone Encounter (Signed)
[  3:26 PM] Revonda Standard jefferey Rhodus   [3:26 PM] Revonda Standard do u want him to get an appointment to be seen or the infusion  [3:26 PM] Saguier, Ramon Dredge to be evaluated for. infustion or molnupiravir.        Marylu Lund from clinic stated the amb covid treatment is for [facility] infusion and once the referral is dropped [facility] will contact patients and the infusion is not supposed to be scheduled at the [facility] site, but she will ask the provider at the office if they will see patient for evaluation and etc. And she will also contact patient from here on out.

## 2020-05-11 ENCOUNTER — Telehealth: Payer: Self-pay | Admitting: Family

## 2020-05-11 ENCOUNTER — Other Ambulatory Visit (HOSPITAL_COMMUNITY): Payer: Self-pay | Admitting: Nurse Practitioner

## 2020-05-11 ENCOUNTER — Telehealth (INDEPENDENT_AMBULATORY_CARE_PROVIDER_SITE_OTHER): Payer: 59 | Admitting: Nurse Practitioner

## 2020-05-11 DIAGNOSIS — U071 COVID-19: Secondary | ICD-10-CM

## 2020-05-11 HISTORY — DX: COVID-19: U07.1

## 2020-05-11 MED ORDER — MOLNUPIRAVIR EUA 200MG CAPSULE: 4.0000 | ORAL_CAPSULE | Freq: Two times a day (BID) | ORAL | 0 refills | Status: AC

## 2020-05-11 MED FILL — MOLNUPIRAVIR 200 MG CAPS: 200 | 5 days supply | Qty: 40 | Fill #0

## 2020-05-11 NOTE — Patient Instructions (Signed)
Covid 19 Cough:   Stay well hydrated  Stay active  Deep breathing exercises  May take tylenol or fever or pain  May take mucinex twice daily    Follow up:  Follow up in 2 weeks or sooner if needed  

## 2020-05-11 NOTE — Progress Notes (Signed)
Virtual Visit via Telephone Note  I connected with Lorayne Marek on 05/11/20 at  8:30 AM EST by telephone and verified that I am speaking with the correct person using two identifiers.  Location: Patient: home Provider: office   I discussed the limitations, risks, security and privacy concerns of performing an evaluation and management service by telephone and the availability of in person appointments. I also discussed with the patient that there may be a patient responsible charge related to this service. The patient expressed understanding and agreed to proceed.   Chief Complaint  Patient presents with  . Covid Positive    + COVID feb 6th, symptoms started sat, congestion, SOB, weakness, cough. PCP prescribed symbicort, flonase      History of Present Illness:  Patient presents for post COVID care clinic visit through televisit.  Patient tested positive for Covid on February 6.  He states his symptoms started on February 5.  Patient states that he has been short of breath with exertion, has chest congestion, has weakness, and nonproductive cough.  His PCP did prescribe Symbicort and Flonase.  Patient states that his O2 sats have been between 92 to 95% on room air.  He is trying to stay active.  He is trying to eat well and stay well-hydrated. Denies f/c/s, n/v/d, hemoptysis, PND, chest pain or edema.   Observations/Objective:  Vitals with BMI 05/09/2020 04/10/2020 01/28/2020  Height - 5\' 7"  -  Weight - 208 lbs -  BMI - 32.57 -  Systolic 131 128  Diastolic 93 78 90  Pulse 101 66 -     Assessment and Plan:  Covid 19 Cough:   Stay well hydrated  Stay active  Deep breathing exercises  May take tylenol or fever or pain  May take mucinex  twice daily     Follow Up Instructions:  Follow up:  Follow up in 2 weeks or sooner if needed        I discussed the assessment and treatment plan with the patient. The patient was provided an opportunity to ask  questions and all were answered. The patient agreed with the plan and demonstrated an understanding of the instructions.   The patient was advised to call back or seek an in-person evaluation if the symptoms worsen or if the condition fails to improve as anticipated.  I provided  23 minutes of non-face-to-face time during this encounter.   161, NP

## 2020-05-11 NOTE — Telephone Encounter (Signed)
Called to discuss with patient about COVID-19 symptoms and the use of one of the available treatments for those with mild to moderate Covid symptoms and at a high risk of hospitalization.  Pt appears to qualify for outpatient treatment due to co-morbid conditions and/or a member of an at-risk group in accordance with the FDA Emergency Use Authorization.    Symptom onset: 05/06/20 Vaccinated: No Booster? No Immunocompromised? No Qualifiers: Hypertension, diabetes, BMI 32   I attempted to contact Joshua Skinner and was unable to reach him via phone. Call back number was left in voicemail. No Mychart was available.  Marcos Eke, NP 05/11/2020 1:51 PM

## 2020-05-19 ENCOUNTER — Ambulatory Visit (INDEPENDENT_AMBULATORY_CARE_PROVIDER_SITE_OTHER): Payer: 59 | Admitting: Nurse Practitioner

## 2020-05-19 ENCOUNTER — Other Ambulatory Visit: Payer: Self-pay

## 2020-05-19 VITALS — BP 137/90 | HR 81 | Temp 97.7°F | Resp 20 | Wt 204.0 lb

## 2020-05-19 DIAGNOSIS — U071 COVID-19: Secondary | ICD-10-CM | POA: Diagnosis not present

## 2020-05-19 NOTE — Progress Notes (Signed)
@Patient  ID: , male    DOB: 23-Jul-1970, 50 y.o.   MRN: [id number]  Chief Complaint  Patient presents with  . Follow-up    Referring provider: [id number]     HPI  Patient presents today for post COVID care clinic visit/follow-up.  Patient was last seen in our office through televisit on 05/11/2020.  He was prescribed molnupirvir for COVID.  He states that he is compliant with medication.  He states that he is improving.  He denies any significant shortness of breath. Denies f/c/s, n/v/d, hemoptysis, PND, chest pain or edema.      Allergies  Allergen Reactions  . Vicks Nyquil Cough [Doxylamine-Dm]     insomnia     There is no immunization history on file for this patient.  Past Medical History:  Diagnosis Date  . Asthma   . Diabetes mellitus without complication (HCC)   . Hyperlipidemia   . Hypertension     Tobacco History: Social History   Tobacco Use  Smoking Status Never Smoker  Smokeless Tobacco Never Used   Counseling given: Yes   Outpatient Encounter Medications as of 05/19/2020  Medication Sig  . allopurinol (ZYLOPRIM) 300 MG tablet Take 300 mg by mouth daily.  05/21/2020 amLODipine (NORVASC) 5 MG tablet Take 1 tablet (5 mg total) by mouth daily.  Marland Kitchen aspirin 81 MG chewable tablet Chew 81 mg by mouth daily.  Marland Kitchen atorvastatin (LIPITOR) 40 MG tablet Take 1 tablet (40 mg total) by mouth daily.  . budesonide-formoterol (SYMBICORT) 160-4.5 MCG/ACT inhaler Inhale 2 puffs into the lungs 2 (two) times daily.  . fluticasone (FLONASE) 50 MCG/ACT nasal spray Place 2 sprays into both nostrils daily.  Marland Kitchen lisinopril (ZESTRIL) 20 MG tablet Take 1 tablet (20 mg total) by mouth daily.  . metFORMIN (GLUCOPHAGE) 1000 MG tablet Take 1 tablet (1,000 mg total) by mouth 2 (two) times daily.  . sitaGLIPtin (JANUVIA) 25 MG tablet Take 25 mg by mouth daily.  . tamsulosin (FLOMAX) 0.4 MG CAPS capsule Take 0.4 mg by mouth at bedtime.  . saxagliptin HCl (ONGLYZA) 5 MG  TABS tablet Take 1 tablet (5 mg total) by mouth daily. (Patient not taking: Reported on 05/19/2020)  . [DISCONTINUED] azithromycin (ZITHROMAX) 250 MG tablet Take 2 tablets by mouth on day 1, followed by 1 tablet by mouth daily for 4 days. (Patient not taking: No sig reported)  . [DISCONTINUED] benzonatate (TESSALON PERLES) 100 MG capsule Take 1 capsule (100 mg total) by mouth 3 (three) times daily as needed for cough (cough). (Patient not taking: No sig reported)   No facility-administered encounter medications on file as of 05/19/2020.     Review of Systems  Review of Systems  Constitutional: Negative for fatigue and fever.  HENT: Negative.   Respiratory: Negative for cough and shortness of breath.   Cardiovascular: Negative.  Negative for chest pain, palpitations and leg swelling.  Gastrointestinal: Negative.   Allergic/Immunologic: Negative.   Neurological: Negative.   Psychiatric/Behavioral: Negative.        Physical Exam  BP 137/90   Pulse 81   Temp 97.7 F (36.5 C)   Resp 20   Wt 204 lb (92.5 kg)   SpO2 99%   BMI 31.95 kg/m   Wt Readings from Last 5 Encounters:  05/19/20 204 lb (92.5 kg)  04/10/20 208 lb (94.3 kg)  01/28/20 209 lb (94.8 kg)  03/28/19 190 lb (86.2 kg)     Physical Exam Vitals and nursing note reviewed.  Constitutional:  General: He is not in acute distress.    Appearance: He is well-developed and well-nourished.  Cardiovascular:     Rate and Rhythm: Normal rate and regular rhythm.  Pulmonary:     Effort: Pulmonary effort is normal.     Breath sounds: Normal breath sounds.  Skin:    General: Skin is warm and dry.  Neurological:     Mental Status: He is alert and oriented to person, place, and time.  Psychiatric:        Mood and Affect: Mood and affect and mood normal.        Behavior: Behavior normal.        Assessment & Plan:   COVID-19 Shortness of breath History of asthma:   Stay well hydrated  Stay active  Deep  breathing exercises  May start vitamin C daily, vitamin D3 daily, Zinc daily  May take tylenol for fever or pain    Follow up:  Follow up if needed      Ivonne Andrew, NP 05/22/2020

## 2020-05-19 NOTE — Patient Instructions (Addendum)
Covid 19 Shortness of breath History of asthma:   Stay well hydrated  Stay active  Deep breathing exercises  May start vitamin C daily, vitamin D3 daily, Zinc daily  May take tylenol for fever or pain    Follow up:  Follow up if needed

## 2020-05-22 ENCOUNTER — Other Ambulatory Visit (INDEPENDENT_AMBULATORY_CARE_PROVIDER_SITE_OTHER): Payer: 59

## 2020-05-22 ENCOUNTER — Other Ambulatory Visit: Payer: Self-pay

## 2020-05-22 ENCOUNTER — Telehealth: Payer: Self-pay | Admitting: Medical

## 2020-05-22 DIAGNOSIS — E785 Hyperlipidemia, unspecified: Secondary | ICD-10-CM

## 2020-05-22 DIAGNOSIS — I10 Essential (primary) hypertension: Secondary | ICD-10-CM | POA: Diagnosis not present

## 2020-05-22 DIAGNOSIS — E119 Type 2 diabetes mellitus without complications: Secondary | ICD-10-CM

## 2020-05-22 LAB — HEMOGLOBIN A1C: Hgb A1c MFr Bld: 7.4 % — ABNORMAL HIGH (ref 4.6–6.5)

## 2020-05-22 LAB — COMPREHENSIVE METABOLIC PANEL
ALT: 27 U/L (ref 0–53)
AST: 29 U/L (ref 0–37)
Albumin: 4.6 g/dL (ref 3.5–5.2)
Alkaline Phosphatase: 39 U/L (ref 39–117)
BUN: 21 mg/dL (ref 6–23)
CO2: 25 mEq/L (ref 19–32)
Calcium: 9.8 mg/dL (ref 8.4–10.5)
Chloride: 104 mEq/L (ref 96–112)
Creatinine, Ser: 1.22 mg/dL (ref 0.40–1.50)
GFR: 69.42 mL/min (ref >60.00)
Glucose, Bld: 155 mg/dL — ABNORMAL HIGH (ref 70–99)
Potassium: 4.1 mEq/L (ref 3.5–5.1)
Sodium: 139 mEq/L (ref 135–145)
Total Bilirubin: 0.7 mg/dL (ref 0.2–1.2)
Total Protein: 7 g/dL (ref 6.0–8.3)

## 2020-05-22 LAB — LIPID PANEL
Cholesterol: 107 mg/dL (ref 0–200)
HDL: 31.4 mg/dL — ABNORMAL LOW (ref >39.00)
NonHDL: 75.46
Total CHOL/HDL Ratio: 3
Triglycerides: 245 mg/dL — ABNORMAL HIGH (ref 0.0–149.0)
VLDL: 49 mg/dL — ABNORMAL HIGH (ref 0.0–40.0)

## 2020-05-22 LAB — LDL CHOLESTEROL, DIRECT: Direct LDL: 49 mg/dL

## 2020-05-22 MED ORDER — SITAGLIPTIN PHOSPHATE 50 MG PO TABS: 50.0000 mg | ORAL_TABLET | Freq: Every day | ORAL | 3 refills | Status: DC

## 2020-05-22 NOTE — Assessment & Plan Note (Signed)
Shortness of breath History of asthma:   Stay well hydrated  Stay active  Deep breathing exercises  May start vitamin C daily, vitamin D3 daily, Zinc daily  May take tylenol for fever or pain    Follow up:  Follow up if needed

## 2020-05-22 NOTE — Telephone Encounter (Signed)
Higher dose januvia sent to pt pharmacy.

## 2020-06-05 ENCOUNTER — Other Ambulatory Visit: Payer: Self-pay | Admitting: Medical

## 2020-07-02 ENCOUNTER — Other Ambulatory Visit: Payer: Self-pay | Admitting: Medical

## 2020-07-10 ENCOUNTER — Ambulatory Visit: Payer: 59 | Admitting: Family Medicine

## 2020-07-10 ENCOUNTER — Telehealth: Payer: Self-pay

## 2020-07-10 DIAGNOSIS — K859 Acute pancreatitis without necrosis or infection, unspecified: Secondary | ICD-10-CM

## 2020-07-10 DIAGNOSIS — K529 Noninfective gastroenteritis and colitis, unspecified: Secondary | ICD-10-CM

## 2020-07-10 HISTORY — DX: Noninfective gastroenteritis and colitis, unspecified: K52.9

## 2020-07-10 HISTORY — DX: Acute pancreatitis without necrosis or infection, unspecified: K85.90

## 2020-07-10 NOTE — Telephone Encounter (Signed)
Initial Comment Caller states she has a few patients to get triaged. The patient has coughing and vomiting after taking his metformin. Translation No Disp. Time Lamount Cohen Time) Disposition Final User 07/10/2020 9:20:16 AM Attempt made - message left Harriette Bouillon 07/10/2020 9:33:49 AM Attempt made - no message left Harriette Bouillon 07/10/2020 9:47:37 AM FINAL ATTEMPT MADE - message left Yes Vear Clock, RN, Alcario Drought

## 2020-07-10 NOTE — Telephone Encounter (Signed)
Pt has appointment with Dr. Carmelia Roller 07/10/20

## 2020-07-13 DIAGNOSIS — E669 Obesity, unspecified: Secondary | ICD-10-CM

## 2020-07-13 HISTORY — DX: Obesity, unspecified: E66.9

## 2020-07-21 ENCOUNTER — Inpatient Hospital Stay: Payer: 59 | Admitting: Medical

## 2020-07-28 ENCOUNTER — Inpatient Hospital Stay: Payer: 59 | Admitting: Medical

## 2020-08-04 ENCOUNTER — Ambulatory Visit (INDEPENDENT_AMBULATORY_CARE_PROVIDER_SITE_OTHER): Payer: 59 | Admitting: Medical

## 2020-08-04 ENCOUNTER — Other Ambulatory Visit: Payer: Self-pay

## 2020-08-04 ENCOUNTER — Encounter: Payer: Self-pay | Admitting: Gastroenterology

## 2020-08-04 VITALS — BP 161/99 | HR 70 | Temp 98.2°F | Ht 67.0 in | Wt 197.0 lb

## 2020-08-04 DIAGNOSIS — G629 Polyneuropathy, unspecified: Secondary | ICD-10-CM | POA: Diagnosis not present

## 2020-08-04 DIAGNOSIS — R195 Other fecal abnormalities: Secondary | ICD-10-CM

## 2020-08-04 DIAGNOSIS — R109 Unspecified abdominal pain: Secondary | ICD-10-CM

## 2020-08-04 DIAGNOSIS — K529 Noninfective gastroenteritis and colitis, unspecified: Secondary | ICD-10-CM | POA: Diagnosis not present

## 2020-08-04 DIAGNOSIS — E1149 Type 2 diabetes mellitus with other diabetic neurological complication: Secondary | ICD-10-CM

## 2020-08-04 DIAGNOSIS — K861 Other chronic pancreatitis: Secondary | ICD-10-CM

## 2020-08-04 DIAGNOSIS — I10 Essential (primary) hypertension: Secondary | ICD-10-CM

## 2020-08-04 LAB — COMPREHENSIVE METABOLIC PANEL
ALT: 40 U/L (ref 0–53)
AST: 21 U/L (ref 0–37)
Albumin: 4.5 g/dL (ref 3.5–5.2)
Alkaline Phosphatase: 39 U/L (ref 39–117)
BUN: 19 mg/dL (ref 6–23)
CO2: 26 mEq/L (ref 19–32)
Calcium: 9.3 mg/dL (ref 8.4–10.5)
Chloride: 105 mEq/L (ref 96–112)
Creatinine, Ser: 1.04 mg/dL (ref 0.40–1.50)
GFR: 83.96 mL/min (ref >60.00)
Glucose, Bld: 162 mg/dL — ABNORMAL HIGH (ref 70–99)
Potassium: 4.1 mEq/L (ref 3.5–5.1)
Sodium: 141 mEq/L (ref 135–145)
Total Bilirubin: 0.5 mg/dL (ref 0.2–1.2)
Total Protein: 6.8 g/dL (ref 6.0–8.3)

## 2020-08-04 LAB — LIPASE: Lipase: 25 U/L (ref 11.0–59.0)

## 2020-08-04 LAB — CBC WITH DIFFERENTIAL/PLATELET
Basophils Absolute: 0 10*3/uL (ref 0.0–0.1)
Basophils Relative: 0.6 % (ref 0.0–3.0)
Eosinophils Absolute: 0.4 10*3/uL (ref 0.0–0.7)
Eosinophils Relative: 8.6 % — ABNORMAL HIGH (ref 0.0–5.0)
HCT: 40.5 % (ref 39.0–52.0)
Hemoglobin: 13.8 g/dL (ref 13.0–17.0)
Lymphocytes Relative: 34.1 % (ref 12.0–46.0)
Lymphs Abs: 1.7 10*3/uL (ref 0.7–4.0)
MCHC: 34 g/dL (ref 30.0–36.0)
MCV: 84.4 fl (ref 78.0–100.0)
Monocytes Absolute: 0.3 10*3/uL (ref 0.1–1.0)
Monocytes Relative: 6.8 % (ref 3.0–12.0)
Neutro Abs: 2.5 10*3/uL (ref 1.4–7.7)
Neutrophils Relative %: 49.9 % (ref 43.0–77.0)
Platelets: 169 10*3/uL (ref 150.0–400.0)
RBC: 4.8 Mil/uL (ref 4.22–5.81)
RDW: 13.6 % (ref 11.5–15.5)
WBC: 5.1 10*3/uL (ref 4.0–10.5)

## 2020-08-04 MED ORDER — CIPROFLOXACIN HCL 500 MG PO TABS: 500.0000 mg | ORAL_TABLET | Freq: Two times a day (BID) | ORAL | 0 refills | Status: DC

## 2020-08-04 MED ORDER — METRONIDAZOLE 500 MG PO TABS: 500.0000 mg | ORAL_TABLET | Freq: Three times a day (TID) | ORAL | 0 refills | Status: AC

## 2020-08-04 MED ORDER — ONDANSETRON 4 MG PO TBDP: 4.0000 mg | ORAL_TABLET | Freq: Three times a day (TID) | ORAL | 0 refills | Status: AC | PRN

## 2020-08-04 NOTE — Patient Instructions (Signed)
History of recent hospitalization for colitis and pancreatitis.  Some persisting signs and symptoms similar since discharge and slightly increased this Monday.  For prescribe Zofran for nausea and vomiting.  Advised to eat bland healthy diet.  Hydrate with [employer] water or sugar-free Gatorade if needed.  Went ahead and prescribe Cipro antibiotic and Flagyl antibiotic.  If abdomen discomfort persisting by this coming Monday then we will get CT abdomen pelvis.  Patient will give me an update on Monday.  With recent loose stools also advised that if those persist by Monday turn in stool kit on Monday.  History of recent pancreatitis.  No history of alcohol use.  Will repeat CBC, CMP and lipase today.  Patient has stopped Trulicity per discharge summary advisement.  Went ahead and placed referral to GI MD.  May need further work-up for possible inflammatory bowel disease.  Also HIDA scan might need to be done as this was mentioned per patient by MD during his hospitalization.  History of diabetes with possible loose stools associated with metformin.  History of low sugars with sulfonylureas and recent mild pancreatitis while on Trulicity.  Advised patient to eat low sugar diet presently and will go ahead and refer to endocrinologist.  I check sugars daily and if sugars increase dramatically notify us.  Some probable lower extremity diabetic neuropathy.  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  Considered tramadol, Lyrica and gabapentin.  Decided against using those.  On next appointment we will discuss trying amitriptyline low-dose.  Follow-up in 3 weeks or as needed.

## 2020-08-04 NOTE — Progress Notes (Signed)
Subjective:    Patient ID: Joshua Skinner, male    DOB: 01/12/1971, 50 y.o.   MRN: [id number]  HPI  Pt in for follow up from ED on 07-10-2020.   Pt had GI symptoms since middle of March.   Since he got out of hospital had some loose stools with consistency of apple sauce. Since Monday states little worse. Monday night he started to throw up. Threw up about 3-4 times. No fever, no chills or sweats. No vomiting since Monday. No pain after eating.   Admit date: 07/10/2020 Discharge date: 07/13/2020  Hospital Days: 3 days   Ct pelvis done.  Fluid-filled small bowel and colon. There is also some wall prominence of small bowel loops in the right hemiabdomen.Findings may be due to an enterocolitis/diarrheal illness.  2. Mild bladder wall thickening; correlate for cystitis.  3. Mild to moderately enlarged and heterogeneous prostate.  4. Nonspecific focal area of decreased density within the spleen; likely incidental but attention on any follow-up imaging recommended.   US done. IMPRESSION:  1. No visible splenic lesions.  2. Nonobstructing bilateral kidney stones       Active Hospital Problems  Diagnosis Date Noted POA  . *Enterocolitis 07/10/2020 Yes  . Obesity (BMI 30-39.9) 07/13/2020 Yes  . HLD (hyperlipidemia) 07/10/2020 Yes  . Pancreatitis 07/10/2020 Yes  . DM (diabetes mellitus) (*) 08/08/2017 Yes  . HTN (hypertension) 08/08/2017 Yes    Resolved Hospital Problems  No resolved problems to display.   Discharge Medication List as of 07/13/2020 11:03 AM   DISCONTINUED medications   Dulaglutide (TRULICITY) 6.07 PX/1.0GY SOPN   fluticasone propionate (FLONASE) 50 mcg/actuation nasal spray   HYDROcodone-acetaminophen (NORCO) 5-325 mg per tablet   lisinopril-hydrochlorothiazide (PRINZIDE,ZESTORETIC) 10-12.5 MG per tablet   meloxicam (MOBIC) 15 mg tablet   oxyCODONE HCl (ROXICODONE) 5 mg immediate release tablet   New meds ceftin, bentyl and flagyl.   Pt does not  drink or smoke.   Pt states in past loose stool had moderate to severe intermittent severe abdomen pain. Most recent event worse.   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  Nerve- type pain recently on feet. Left side worse than right.   Diabetes- metformin possible diarrehea. glipiizide- had hypoglycemica.  januvia- maybe side effect gi.      Review of Systems  Constitutional: Negative for chills, fatigue and fever.  Respiratory: Negative for cough, chest tightness and wheezing.   Cardiovascular: Negative for chest pain and palpitations.  Gastrointestinal: Positive for abdominal pain.  Musculoskeletal: Negative for back pain.  Skin: Negative for rash.  Neurological: Negative for dizziness and facial asymmetry.       Lower ext neuropathy.  Hematological: Negative for adenopathy.  Psychiatric/Behavioral: Negative for behavioral problems, confusion, self-injury and suicidal ideas. The patient is not hyperactive.     Past Medical History:  Diagnosis Date  . Asthma   . Diabetes mellitus without complication ([place])   . Hyperlipidemia   . Hypertension      Social History   Socioeconomic History  . Marital status: Married    Spouse name: Not on file  . Number of children: Not on file  . Years of education: Not on file  . Highest education level: Not on file  Occupational History  . Not on file  Tobacco Use  . Smoking status: Never Smoker  . Smokeless tobacco: Never Used  Vaping Use  . Vaping Use: Never used  Substance and Sexual Activity  . Alcohol use: Never  . Drug use:  Never  . Sexual activity: Yes  Other Topics Concern  . Not on file  Social History Narrative   ** Merged History Encounter **       Social Determinants of Health   Financial Resource Strain: Not on file  Food Insecurity: Not on file  Transportation Needs: Not on file  Physical Activity: Not on file  Stress: Not on file  Social Connections: Not on file  Intimate Partner Violence: Not on file    Past  Surgical History:  Procedure Laterality Date  . HERNIA REPAIR      No family history on file.  Allergies  Allergen Reactions  . Vicks Nyquil Cough [Doxylamine-Dm]     insomnia    Current Outpatient Medications on File Prior to Visit  Medication Sig Dispense Refill  . allopurinol (ZYLOPRIM) 300 MG tablet Take 300 mg by mouth daily.    Marland Kitchen amLODipine (NORVASC) 5 MG tablet Take 1 tablet (5 mg total) by mouth daily. 30 tablet 3  . aspirin 81 MG chewable tablet Chew 81 mg by mouth daily.    Marland Kitchen atorvastatin (LIPITOR) 40 MG tablet Take 1 tablet (40 mg total) by mouth daily. 90 tablet 1  . metFORMIN (GLUCOPHAGE) 1000 MG tablet Take 1 tablet (1,000 mg total) by mouth 2 (two) times daily. 60 tablet 11  . sitaGLIPtin (JANUVIA) 50 MG tablet Take 1 tablet (50 mg total) by mouth daily. 90 tablet 3  . tamsulosin (FLOMAX) 0.4 MG CAPS capsule Take 0.4 mg by mouth at bedtime.    Marland Kitchen lisinopril (ZESTRIL) 20 MG tablet Take 1 tablet (20 mg total) by mouth daily. 30 tablet 2   No current facility-administered medications on file prior to visit.    BP (!) 161/99   Pulse 70   Temp 98.2 F (36.8 C)   Ht 5' 7" (1.702 m)   Wt 197 lb (89.4 kg)   SpO2 98%   BMI 30.85 kg/m       Objective:   Physical Exam  General Mental Status- Alert. General Appearance- Not in acute distress.   Skin General: Color- Normal Color. Moisture- Normal Moisture.  Neck Carotid Arteries- Normal color. Moisture- Normal Moisture. No carotid bruits. No JVD.  Chest and Lung Exam Auscultation: Breath Sounds:-Normal.  Cardiovascular Auscultation:Rythm- Regular. Murmurs & Other Heart Sounds:Auscultation of the heart reveals- No Murmurs.  Abdomen Inspection:-Inspeection Normal. Palpation/Percussion:Note:No mass. Palpation and Percussion of the abdomen reveal-mild tender just right of umbilicus.  No direct tenderness right upper quadrant or right lower quadrant., Non Distended + BS, no rebound or  guarding.    Neurologic Cranial Nerve exam:- CN III-XII intact(No nystagmus), symmetric smile. Strength:- 5/5 equal and symmetric strength both upper and lower extremities.  Lower extremity- dorsalis pedis and posterior tibial pulse intact.  Feet feel normal temperature.  Good capillary refill both sides.      Assessment & Plan:  History of recent hospitalization for colitis and pancreatitis.  Some persisting signs and symptoms similar since discharge and slightly increased this Monday.  For prescribe Zofran for nausea and vomiting.  Advised to eat bland healthy diet.  Hydrate with [employer] water or sugar-free Gatorade if needed.  Went ahead and prescribe Cipro antibiotic and Flagyl antibiotic.  If abdomen discomfort persisting by this coming Monday then we will get CT abdomen pelvis.  Patient will give me an update on Monday.  With recent loose stools also advised that if those persist by Monday turn in stool kit on Monday.  History of recent  pancreatitis.  No history of alcohol use.  Will repeat CBC, CMP and lipase today.  Patient has stopped Trulicity per discharge summary advisement.  Went ahead and placed referral to GI MD.  May need further work-up for possible inflammatory bowel disease.  Also HIDA scan might need to be done as this was mentioned per patient by MD during his hospitalization.  History of diabetes with possible loose stools associated with metformin.  History of low sugars with sulfonylureas and recent mild pancreatitis while on Trulicity.  Advised patient to eat low sugar diet presently and will go ahead and refer to endocrinologist.  I check sugars daily and if sugars increase dramatically notify us.  Some probable lower extremity diabetic neuropathy.  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  Considered tramadol, Lyrica and gabapentin.  Decided against using those.  On next appointment we will discuss trying  amitriptyline low-dose.  Follow-up in 3 weeks or as needed.  Time spent with patient today was 56  minutes which consisted of chart revdew, discussing diagnosis, work up, treatment, answering questions  and documentation.

## 2020-08-05 ENCOUNTER — Encounter: Payer: Self-pay | Admitting: Medical

## 2020-08-07 ENCOUNTER — Telehealth: Payer: Self-pay | Admitting: Medical

## 2020-08-07 ENCOUNTER — Other Ambulatory Visit: Payer: Self-pay

## 2020-08-07 ENCOUNTER — Other Ambulatory Visit (INDEPENDENT_AMBULATORY_CARE_PROVIDER_SITE_OTHER): Payer: 59

## 2020-08-07 DIAGNOSIS — R195 Other fecal abnormalities: Secondary | ICD-10-CM | POA: Diagnosis not present

## 2020-08-07 MED ORDER — SILDENAFIL CITRATE 50 MG PO TABS: 50.0000 mg | ORAL_TABLET | Freq: Every day | ORAL | 0 refills | Status: DC | PRN

## 2020-08-07 NOTE — Telephone Encounter (Signed)
Rx viagra sent to pt pharmacy. 

## 2020-08-09 ENCOUNTER — Encounter: Payer: Self-pay | Admitting: Gastroenterology

## 2020-08-09 LAB — CLOSTRIDIUM DIFFICILE BY PCR: Toxigenic C. Difficile by PCR: NEGATIVE

## 2020-08-11 LAB — STOOL CULTURE: E coli, Shiga toxin Assay: NEGATIVE

## 2020-08-14 ENCOUNTER — Other Ambulatory Visit: Payer: Self-pay

## 2020-08-14 ENCOUNTER — Ambulatory Visit: Payer: 59 | Admitting: Internal Medicine

## 2020-08-14 VITALS — BP 160/98 | HR 72 | Ht 67.0 in | Wt 202.1 lb

## 2020-08-14 DIAGNOSIS — E1149 Type 2 diabetes mellitus with other diabetic neurological complication: Secondary | ICD-10-CM | POA: Diagnosis not present

## 2020-08-14 DIAGNOSIS — E785 Hyperlipidemia, unspecified: Secondary | ICD-10-CM

## 2020-08-14 LAB — POCT GLYCOSYLATED HEMOGLOBIN (HGB A1C): Hemoglobin A1C: 7.8 % — AB (ref 4.0–5.6)

## 2020-08-14 LAB — OVA AND PARASITE EXAMINATION
CONCENTRATE RESULT:: NONE SEEN
MICRO NUMBER:: 11865754
SPECIMEN QUALITY:: ADEQUATE
TRICHROME RESULT:: NONE SEEN

## 2020-08-14 LAB — POCT GLUCOSE (DEVICE FOR HOME USE): POC Glucose: 170 mg/dl — AB (ref 70–99)

## 2020-08-14 MED ORDER — DAPAGLIFLOZIN PROPANEDIOL 5 MG PO TABS: 5.0000 mg | ORAL_TABLET | Freq: Every day | ORAL | 4 refills | Status: DC

## 2020-08-14 MED ORDER — METFORMIN HCL ER 500 MG PO TB24: 500.0000 mg | ORAL_TABLET | Freq: Two times a day (BID) | ORAL | 1 refills | Status: DC

## 2020-08-14 NOTE — Progress Notes (Signed)
Name: Joshua Skinner  MRN/ DOB: [id number], 1971-02-12   Age/ Sex: 50 y.o., male    PCP: Maris[facility]   Reason for Endocrinology Evaluation: Type 2 Diabetes Mellitus     Date of Initial Endocrinology Visit: 08/14/2020     PATIENT IDENTIFIER: Joshua Skinner is a 50 y.o. male with a past medical history of HTN, T2 DM, and dyslipidemia. The patient presented for initial endocrinology clinic visit on 08/14/2020 for consultative assistance with his diabetes management.    HPI: [name] is accompanied by his spouse and daughter    Diagnosed with DM  2012 Prior Medications tried/Intolerance: Glimepiride , Ozempic/ trulicity - bad diarrhea  Currently checking blood sugars occasionally   Hypoglycemia episodes : no     recent       Hemoglobin A1c has ranged from 7.4% in 2022, peaking at 8.1% in 2021. Patient required assistance for hypoglycemia: no Patient has required hospitalization within the last 1 year from hyper or hypoglycemia: no   In terms of diet, the patient eats 2 meals a day, snacks occasionally. Drinks sugar sweetened beverages   Drives a truck   Over the past 2 years he has been noted with episodes of unexplained nausea vomiting and at times diarrhea.  The symptoms have been worsening in nature over the past year.  He has been told in the past that he has elevated lipase, but CT scan does not show any evidence of pancreatitis.  He is concerned that metformin may have played a role in these symptoms.   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  He has been using OTC neuropathy oil which has been helping      HOME DIABETES REGIMEN: Metformin 1000 mg BID  Januvia 50 mg - not taking - cost prohibitive    Statin: yes ACE-I/ARB: yes  Prior Diabetic Education: no   METER DOWNLOAD SUMMARY: Did not bring    DIABETIC COMPLICATIONS: Microvascular complications:    Denies: CKD ,  retinopathy, neuropathy   Last eye exam: Completed 2022  Macrovascular complications:    Denies: CAD, PVD, CVA   PAST HISTORY: Past Medical History:  Past Medical History:  Diagnosis Date  . Asthma   . Diabetes mellitus without complication ([facility])   . Hemorrhoids   . Hyperlipidemia   . Hypertension    Past Surgical History:  Past Surgical History:  Procedure Laterality Date  . COLONOSCOPY  08/05/2008   Colon polyp sttus post polypectomy. Small internal hemorrhoids (most likely etiology of bright red blood per rectum) Rare diverticula in the sigmoid colon.   Marland Kitchen HERNIA REPAIR Bilateral    ingunial      Social History:  reports that he has never smoked. He has never used smokeless tobacco. He reports that he does not drink alcohol and does not use drugs.  Family History: No family history on file.   HOME MEDICATIONS: Allergies as of 08/14/2020      Reactions   Vicks Nyquil Cough [doxylamine-dm]    insomnia      Medication List       Accurate as of Aug 14, 2020  1:33 PM. If you have any questions, ask your nurse or doctor.        STOP taking these medications   ciprofloxacin 500 MG tablet Commonly known as: CIPRO Stopped by: Scarlette Shorts, MD   metFORMIN 1000 MG tablet Commonly known as:  GLUCOPHAGE Replaced by: metFORMIN 500 MG 24 hr tablet Stopped by: Scarlette Shorts, MD   sitaGLIPtin 50 MG tablet Commonly known as: Januvia Stopped by: Scarlette Shorts, MD     TAKE these medications   allopurinol 300 MG tablet Commonly known as: ZYLOPRIM Take 300 mg by mouth daily.   amLODipine 5 MG tablet Commonly known as: NORVASC Take 1 tablet (5 mg total) by mouth daily.   aspirin 81 MG chewable tablet Chew 81 mg by mouth daily.   atorvastatin 40 MG tablet Commonly known as: LIPITOR Take 1 tablet (40 mg total) by mouth daily.   dapagliflozin propanediol 5 MG Tabs tablet Commonly known as: Farxiga Take 1 tablet (5 mg total) by mouth  daily. Started by: Scarlette Shorts, MD   lisinopril 20 MG tablet Commonly known as: ZESTRIL Take 1 tablet (20 mg total) by mouth daily.   metFORMIN 500 MG 24 hr tablet Commonly known as: GLUCOPHAGE-XR Take 1 tablet (500 mg total) by mouth in the morning and at bedtime. Replaces: metFORMIN 1000 MG tablet Started by: Scarlette Shorts, MD   ondansetron 4 MG disintegrating tablet Commonly known as: Zofran ODT Take 1 tablet (4 mg total) by mouth every 8 (eight) hours as needed for nausea or vomiting.   sildenafil 50 MG tablet Commonly known as: Viagra Take 1 tablet (50 mg total) by mouth daily as needed for erectile dysfunction.   tamsulosin 0.4 MG Caps capsule Commonly known as: FLOMAX Take 0.4 mg by mouth at bedtime.        ALLERGIES: Allergies  Allergen Reactions  . Vicks Nyquil Cough [Doxylamine-Dm]     insomnia     REVIEW OF SYSTEMS: A comprehensive ROS was conducted with the patient and is negative except as per HPI \   OBJECTIVE:   VITAL SIGNS: BP (!) 160/98   Pulse 72   Ht 5\' 7"  (1.702 m)   Wt 202 lb 2 oz (91.7 kg)   SpO2 98%   BMI 31.66 kg/m    PHYSICAL EXAM:  General: Pt appears well and is in NAD  Neck: General: Supple without adenopathy or carotid bruits. Thyroid: Thyroid size normal.  No goiter or nodules appreciated.   Lungs: Clear with good BS bilat with no rales, rhonchi, or wheezes  Heart: RRR with normal S1 and S2 and no gallops; no murmurs; no rub  Abdomen: Normoactive bowel sounds, soft, nontender, without masses or organomegaly palpable  Extremities:  Lower extremities - No pretibial edema. No lesions.  Skin: Normal texture and temperature to palpation. No rash noted. No Acanthosis nigricans/skin tags. No lipohypertrophy.  Neuro: MS is good with appropriate affect, pt is alert and Ox3    DM foot exam: 08/14/2020  The skin of the feet is intact without sores or ulcerations. The pedal pulses are 2+ on right and 2+ on left. The  sensation is more sensitive  to a screening 5.07, 10 gram monofilament at the great toes   DATA REVIEWED:  Lab Results  Component Value Date   HGBA1C 7.8 (A) 08/14/2020   HGBA1C 7.4 (H) 05/22/2020   HGBA1C 8.1 (H) 01/28/2020   Lab Results  Component Value Date   LDLCALC 42 01/28/2020   CREATININE 1.04 08/04/2020    Lab Results  Component Value Date   CHOL 107 05/22/2020   HDL 31.40 (L) 05/22/2020   LDLCALC 42 01/28/2020   LDLDIRECT 49.0 05/22/2020   TRIG 245.0 (H) 05/22/2020   CHOLHDL 3 05/22/2020  ASSESSMENT / PLAN / RECOMMENDATIONS:   1) Type 2 Diabetes Mellitus, Sub-Optimally controlled, With neuropathic complications - Most recent A1c of 7.4 %. Goal A1c < 7.0 %.    Plan: GENERAL: I have discussed with the patient the pathophysiology of diabetes. We went over the natural progression of the disease. We talked about both insulin resistance and insulin deficiency. We stressed the importance of lifestyle changes including diet and exercise. I explained the complications associated with diabetes including retinopathy, nephropathy, neuropathy as well as increased risk of cardiovascular disease. We went over the benefit seen with glycemic control.    I explained to the patient that diabetic patients are at higher than normal risk for amputations.   We have emphasized the importance of low-carb diet as well as exercise  Trulicity/Ozempic caused severe diarrhea  Januvia cost prohibitive  We discussed SGLT2 inhibitors, we discussed cardiovascular and renal benefits as well as cautioned against genital infections.  I am not sure at this time if metformin has anything to do with his GI symptoms, but I am going to switch his metformin 1000 mg to 500 XR and keep him at 50% less and see how he does with that  He was advised to hold the metformin for 2 weeks prior to starting the new dose  MEDICATIONS:  Stop metformin 1000 mg  Start metformin 500 XR twice daily-titration  provided  Start Farxiga 5 mg 1 tablet daily  EDUCATION / INSTRUCTIONS:  BG monitoring instructions: Patient is instructed to check his blood sugars 1 times a day, fasting.  Call [facility] if: BG persistently < 70 . I reviewed the Rule of 15 for the treatment of hypoglycemia in detail with the patient. Literature supplied.   2) Diabetic complications:   Eye: Does not have known diabetic retinopathy.   Neuro/ Feet: Does  have known diabetic peripheral neuropathy.  Renal: Patient does not have known baseline CKD. He is on an ACEI/ARB at present  3) Dyslipidemia : Patient is on atorvastatin 40 mg daily.  We discussed cardiovascular benefits of statins and I have encouraged compliance Tg above goal , LDL at goal     Medication Continue atorvastatin 40 mg daily    4) Peripheral Neuropathy:  -Patient may use capsaicin cream as needed  Follow-up in 3 months    Signed electronically by: Lyndle Herrlich, MD  [facility]  [facility] [address] [place], [place] [address] Phone: [phone] FAX: [phone]   CC: Maris[facility] [address] [place] [address] Phone: [phone]  Fax: [phone]    Return to Endocrinology clinic as below: Future Appointments  Date Time Provider Department Center  09/11/2020 10:00 AM Lynann Bologna, MD [facility]  11/15/2020  9:10 AM Sharona Rovner, Konrad Dolores, MD [facility] None

## 2020-08-14 NOTE — Patient Instructions (Signed)
-   Start Farxiga 5 mg, 1 tablet in the morning  - Stop Metformin 1000 mg, after 2 weeks, please start Metformin 500 mg XR, 1 tablet with Breakfast for 1 week, if no side Nausea/Vomiting or diarrhea, you may increase to 1 tablet with breakfast and 1 tablet with supper     Choose healthy, lower carb lower calorie snacks: toss salad,vegetables, cottage cheese, peanut butter, low fat cheese / string cheese, lower sodium deli meat, tuna salad or chicken salad    HOW TO TREAT LOW BLOOD SUGARS (Blood sugar LESS THAN 70 MG/DL)  Please follow the RULE OF 15 for the treatment of hypoglycemia treatment (when your (blood sugars are less than 70 mg/dL)    STEP 1: Take 15 grams of carbohydrates when your blood sugar is low, which includes:   3-4 GLUCOSE TABS  OR  3-4 OZ OF JUICE OR REGULAR SODA OR  ONE TUBE OF GLUCOSE GEL     STEP 2: RECHECK blood sugar in 15 MINUTES STEP 3: If your blood sugar is still low at the 15 minute recheck --> then, go back to STEP 1 and treat AGAIN with another 15 grams of carbohydrates.

## 2020-09-11 ENCOUNTER — Ambulatory Visit: Payer: 59 | Admitting: Gastroenterology

## 2020-09-27 ENCOUNTER — Encounter: Payer: Self-pay | Admitting: Gastroenterology

## 2020-09-28 ENCOUNTER — Encounter: Payer: Self-pay | Admitting: Gastroenterology

## 2020-10-09 ENCOUNTER — Other Ambulatory Visit: Payer: Self-pay

## 2020-10-09 MED ORDER — AMLODIPINE BESYLATE 5 MG PO TABS: 5.0000 mg | ORAL_TABLET | Freq: Every day | ORAL | 3 refills | Status: DC

## 2020-10-21 ENCOUNTER — Other Ambulatory Visit: Payer: Self-pay | Admitting: Medical

## 2020-10-30 ENCOUNTER — Other Ambulatory Visit: Payer: Self-pay

## 2020-10-30 ENCOUNTER — Encounter: Payer: Self-pay | Admitting: Gastroenterology

## 2020-10-30 ENCOUNTER — Ambulatory Visit (INDEPENDENT_AMBULATORY_CARE_PROVIDER_SITE_OTHER): Payer: 59 | Admitting: Gastroenterology

## 2020-10-30 VITALS — BP 144/92 | HR 75 | Ht 67.0 in | Wt 200.0 lb

## 2020-10-30 DIAGNOSIS — K921 Melena: Secondary | ICD-10-CM

## 2020-10-30 DIAGNOSIS — R142 Eructation: Secondary | ICD-10-CM

## 2020-10-30 DIAGNOSIS — Z86010 Personal history of colonic polyps: Secondary | ICD-10-CM

## 2020-10-30 DIAGNOSIS — R748 Abnormal levels of other serum enzymes: Secondary | ICD-10-CM

## 2020-10-30 DIAGNOSIS — R112 Nausea with vomiting, unspecified: Secondary | ICD-10-CM

## 2020-10-30 MED ORDER — SUMATRIPTAN 20 MG/ACT NA SOLN: NASAL | 0 refills | Status: DC

## 2020-10-30 NOTE — Patient Instructions (Addendum)
If you are age 50 or older, your body mass index should be between 23-30. Your Body mass index is 31.32 kg/m. If this is out of the aforementioned range listed, please consider follow up with your Primary Care Provider.  If you are age 11 or younger, your body mass index should be between 19-25. Your Body mass index is 31.32 kg/m. If this is out of the aformentioned range listed, please consider follow up with your Primary Care Provider.   __________________________________________________________  The  GI providers would like to encourage you to use [facility] to communicate with providers for non-urgent requests or questions.  Due to long hold times on the telephone, sending your provider a message by [facility] may be a faster and more efficient way to get a response.  Please allow 48 business hours for a response.  Please remember that this is for non-urgent requests.    We have sent the following medications to your pharmacy for you to pick up at your convenience: Sumatriptan  Please purchase the following medications over the counter and take as directed:   Miralax and Ducolax  Due to recent changes in healthcare laws, you may see the results of your imaging and laboratory studies on MyChart before your provider has had a chance to review them.  We understand that in some cases there may be results that are confusing or concerning to you. Not all laboratory results come back in the same time frame and the provider may be waiting for multiple results in order to interpret others.  Please give [place] 48 hours in order for your provider to thoroughly review all the results before contacting the office for clarification of your results.   Marland Kitchen

## 2020-10-30 NOTE — Progress Notes (Signed)
Chief Complaint: Hematochezia  Referring Provider:     Marisue Brooklyn   HPI:     Joshua Skinner is a 50 y.o. male with a history of HTN, HLD, diabetes, referred to the [facility] for evaluation of hematochezia.  He has had intermittent BRBPR for multiple years. Worse when he eats red meat. Can have clots and BRB, filling toilet water. Has had hemorrhoid banding at [facility] a few years ago. Reports he is also due to repeat colonoscopy, but has since changed insurance and changed his care to [facility].   Has had 2-3 colonoscopies (polyp on 1 of those per patient) in the past. Last may have been 2 years ago and recently received recall letter. Has had EGD x1 (gastritis, hiatal hernia per patient) done for n/v over the last 4-5 years.   Separately, has had episodic belching followed by nausea/vomiting for the last 2-3 years.  Has had 4-5 episodes in total, lasting a few days up to a week at a time.  No associated fevers.  No clear triggers.  Feels fine in between episodes.  Had 1 of those episodes in April with associated diarrhea and was admitted to [facility] 4/01-2013, 2022. - Lipase 125 - GI bio fire and C. difficile negative - Abdominal ultrasound unrevealing - CT abdomen/pelvis with fluid-filled small bowel and colon and prominence of small bowel loops in right hemiabdomen. - Was empirically treated with ceftriaxone, Flagyl, and Bentyl. - Was seen by inpatient GI with recommendation for continued antibiotics and consideration for outpatient GES in 4 weeks if symptoms persist.   Repeat labs in 07/2020: Negative/normal culture, ova/parasite, C. difficile.  Normal CBC, CMP.  Since April d/c, no recurrence of of n/v/d.   No known family history of CRC, GI malignancy, liver disease, pancreatic disease, or IBD.    Past Medical History:  Diagnosis Date   Anxiety    Arthritis    Asthma    Diabetes mellitus without complication (HCC)     Hemorrhoids    Hyperlipidemia    Hypertension      Past Surgical History:  Procedure Laterality Date   COLONOSCOPY  08/05/2008   Colon polyp sttus post polypectomy. Small internal hemorrhoids (most likely etiology of bright red blood per rectum) Rare diverticula in the sigmoid colon.    HERNIA REPAIR Bilateral    ingunial   Family History  Problem Relation Age of Onset   Diabetes Mother    Irritable bowel syndrome Mother    Cirrhosis Mother    Prostate cancer Father    Diabetes Father    Kidney disease Father    Diabetes Sister    Cirrhosis Sister    Pancreatic cancer Neg Hx    Colon cancer Neg Hx    Stomach cancer Neg Hx    Esophageal cancer Neg Hx    Social History   Tobacco Use   Smoking status: Never   Smokeless tobacco: Never  Vaping Use   Vaping Use: Never used  Substance Use Topics   Alcohol use: Never   Drug use: Never   Current Outpatient Medications  Medication Sig Dispense Refill   allopurinol (ZYLOPRIM) 300 MG tablet Take 300 mg by mouth daily.     amLODipine (NORVASC) 5 MG tablet Take 1 tablet (5 mg total) by mouth daily. 30 tablet 3   aspirin 81 MG chewable tablet Chew 81 mg by mouth daily.  atorvastatin (LIPITOR) 40 MG tablet Take 1 tablet by mouth once daily 90 tablet 0   dapagliflozin propanediol (FARXIGA) 5 MG TABS tablet Take 1 tablet (5 mg total) by mouth daily. 30 tablet 4   lisinopril (ZESTRIL) 20 MG tablet Take 1 tablet by mouth once daily 30 tablet 0   metFORMIN (GLUCOPHAGE-XR) 500 MG 24 hr tablet Take 1 tablet (500 mg total) by mouth in the morning and at bedtime. 180 tablet 1   ondansetron (ZOFRAN ODT) 4 MG disintegrating tablet Take 1 tablet (4 mg total) by mouth every 8 (eight) hours as needed for nausea or vomiting. 20 tablet 0   sildenafil (VIAGRA) 50 MG tablet Take 1 tablet (50 mg total) by mouth daily as needed for erectile dysfunction. 10 tablet 0   tamsulosin (FLOMAX) 0.4 MG CAPS capsule Take 0.4 mg by mouth at bedtime.      omeprazole (PRILOSEC) 40 MG capsule Take 1 capsule by mouth daily as needed.     No current facility-administered medications for this visit.   Allergies  Allergen Reactions   Vicks Nyquil Cough [Doxylamine-Dm]     insomnia     Review of Systems: All systems reviewed and negative except where noted in HPI.     Physical Exam:    Wt Readings from Last 3 Encounters:  10/30/20 200 lb (90.7 kg)  08/14/20 202 lb 2 oz (91.7 kg)  08/04/20 197 lb (89.4 kg)    BP (!) 144/92   Pulse 75   Ht 5\' 7"  (1.702 m)   Wt 200 lb (90.7 kg)   SpO2 97%   BMI 31.32 kg/m  Constitutional:  Pleasant, in no acute distress. Psychiatric: Normal mood and affect. Behavior is normal. EENT: Pupils normal.  Conjunctivae are normal. No scleral icterus. Neck supple. No cervical LAD. Cardiovascular: Normal rate, regular rhythm. No edema Pulmonary/chest: Effort normal and breath sounds normal. No wheezing, rales or rhonchi. Abdominal: Soft, nondistended, nontender. Bowel sounds active throughout. There are no masses palpable. No hepatomegaly. Neurological: Alert and oriented to person place and time. Skin: Skin is warm and dry. No rashes noted.   ASSESSMENT AND PLAN;   1) Nausea/Vomiting - Discussed potential etiology to include CVS - Trial course of sumatriptan prn recurrence of nausea/vomiting - EGD to evaluate for mucosal/luminal pathology  2) Belching - EGD to evaluate for erosive esophagitis, LES laxity, hiatal hernia  3) Hematochezia 4) History of hemorrhoids s/p hemorrhoid banding - Colonoscopy to evaluate for recurrence of hemorrhoids - If clinically significant hemorrhoids, discussed repeat hemorrhoidal banding  5) History of colon polyps - Evaluate for polyps at time of colonoscopy as above - Requested records from [facility] for review  6) Elevated lipase - Mildly elevated lipase during recent admission (225) with otherwise normal pancreas on CT - Suspect related to  intestinal inflammation - Depending on work-up as above, can consider repeat lipase as outpatient to ensure return to normal  The indications, risks, and benefits of EGD and colonoscopy were explained to the patient in detail. Risks include but are not limited to bleeding, perforation, adverse reaction to medications, and cardiopulmonary compromise. Sequelae include but are not limited to the possibility of surgery, hospitalization, and mortality. The patient verbalized understanding and wished to proceed. All questions answered, referred to scheduler and bowel prep ordered. Further recommendations pending results of the exam.    [facility], DO, FACG  10/30/2020, 9:37 AM   [place], 12/30/2020, PA-C

## 2020-11-15 ENCOUNTER — Ambulatory Visit: Payer: 59 | Admitting: Internal Medicine

## 2020-11-17 ENCOUNTER — Other Ambulatory Visit: Payer: Self-pay | Admitting: Gastroenterology

## 2020-11-22 ENCOUNTER — Telehealth: Payer: Self-pay

## 2020-11-22 NOTE — Telephone Encounter (Signed)
Received PA request for Sumatriptan. PA started over the phone, spoke with stephanie at [phone], [facility] - Pharmacy help desk

## 2020-11-23 NOTE — Telephone Encounter (Signed)
Patient returned my call stating that he bought Sumatriptan 20 mg nasal spray out of his pocket. Stated it alleviated his symptoms of nausea last week. He was not sure how much his wife paid when she picked up the refill. He is going to let me know tomorrow. I let him know that his insurance may deny this PA. I will discuss with Dr. Barron Alvine  to see if he sends something else.

## 2020-11-27 ENCOUNTER — Telehealth: Payer: Self-pay

## 2020-11-27 NOTE — Telephone Encounter (Signed)
Thank you :)

## 2020-11-27 NOTE — Telephone Encounter (Signed)
PA Approved for Sumatriptan 20 mg from 11/25/20 - 11/24/21 by [facility]). Pharmacy is notified, will cost about $35 for total 6. Per pharmacy it comes in a pack of 6. I called the patient and left him VM for update.

## 2020-11-27 NOTE — Telephone Encounter (Signed)
Opened by Error

## 2020-11-29 ENCOUNTER — Encounter: Payer: Self-pay | Admitting: Gastroenterology

## 2020-12-03 ENCOUNTER — Other Ambulatory Visit: Payer: Self-pay | Admitting: Medical

## 2020-12-09 ENCOUNTER — Encounter: Payer: Self-pay | Admitting: Medical

## 2020-12-11 ENCOUNTER — Telehealth: Payer: Self-pay | Admitting: Medical

## 2020-12-11 MED ORDER — TAMSULOSIN HCL 0.4 MG PO CAPS: 0.4000 mg | ORAL_CAPSULE | Freq: Every day | ORAL | 0 refills | Status: DC

## 2020-12-11 NOTE — Telephone Encounter (Signed)
Chart opened to rx med, order lab, review chart, respond to my chart message or send message to staff member  

## 2020-12-17 ENCOUNTER — Other Ambulatory Visit: Payer: Self-pay | Admitting: Medical

## 2021-01-08 ENCOUNTER — Ambulatory Visit (HOSPITAL_BASED_OUTPATIENT_CLINIC_OR_DEPARTMENT_OTHER)
Admission: RE | Admit: 2021-01-08 | Discharge: 2021-01-08 | Disposition: A | Discharge status: Home / Self Care | Payer: 59 | Source: Ambulatory Visit | Attending: Medical | Admitting: Medical

## 2021-01-08 ENCOUNTER — Other Ambulatory Visit (HOSPITAL_BASED_OUTPATIENT_CLINIC_OR_DEPARTMENT_OTHER): Payer: Self-pay | Admitting: Medical

## 2021-01-08 ENCOUNTER — Other Ambulatory Visit: Payer: Self-pay

## 2021-01-08 ENCOUNTER — Ambulatory Visit (HOSPITAL_COMMUNITY)
Admission: RE | Admit: 2021-01-08 | Discharge: 2021-01-08 | Disposition: A | Discharge status: Home / Self Care | Payer: 59 | Source: Ambulatory Visit | Attending: Medical | Admitting: Medical

## 2021-01-08 ENCOUNTER — Ambulatory Visit (INDEPENDENT_AMBULATORY_CARE_PROVIDER_SITE_OTHER): Payer: 59 | Admitting: Medical

## 2021-01-08 ENCOUNTER — Encounter: Payer: Self-pay | Admitting: Medical

## 2021-01-08 VITALS — BP 158/90 | HR 72 | Temp 98.5°F | Resp 18 | Ht 67.0 in | Wt 200.2 lb

## 2021-01-08 DIAGNOSIS — N23 Unspecified renal colic: Secondary | ICD-10-CM | POA: Diagnosis not present

## 2021-01-08 DIAGNOSIS — M25559 Pain in unspecified hip: Secondary | ICD-10-CM

## 2021-01-08 DIAGNOSIS — M549 Dorsalgia, unspecified: Secondary | ICD-10-CM | POA: Diagnosis not present

## 2021-01-08 DIAGNOSIS — R1032 Left lower quadrant pain: Secondary | ICD-10-CM

## 2021-01-08 DIAGNOSIS — R319 Hematuria, unspecified: Secondary | ICD-10-CM | POA: Insufficient documentation

## 2021-01-08 DIAGNOSIS — Z1211 Encounter for screening for malignant neoplasm of colon: Secondary | ICD-10-CM

## 2021-01-08 DIAGNOSIS — E1149 Type 2 diabetes mellitus with other diabetic neurological complication: Secondary | ICD-10-CM | POA: Diagnosis not present

## 2021-01-08 LAB — CBC WITH DIFFERENTIAL/PLATELET
Basophils Absolute: 0 10*3/uL (ref 0.0–0.1)
Basophils Relative: 0.5 % (ref 0.0–3.0)
Eosinophils Absolute: 0.2 10*3/uL (ref 0.0–0.7)
Eosinophils Relative: 2.6 % (ref 0.0–5.0)
HCT: 45.6 % (ref 39.0–52.0)
Hemoglobin: 15.6 g/dL (ref 13.0–17.0)
Lymphocytes Relative: 24.4 % (ref 12.0–46.0)
Lymphs Abs: 1.5 10*3/uL (ref 0.7–4.0)
MCHC: 34.1 g/dL (ref 30.0–36.0)
MCV: 84.8 fl (ref 78.0–100.0)
Monocytes Absolute: 0.4 10*3/uL (ref 0.1–1.0)
Monocytes Relative: 6.8 % (ref 3.0–12.0)
Neutro Abs: 4.1 10*3/uL (ref 1.4–7.7)
Neutrophils Relative %: 65.7 % (ref 43.0–77.0)
Platelets: 160 10*3/uL (ref 150.0–400.0)
RBC: 5.38 Mil/uL (ref 4.22–5.81)
RDW: 13.4 % (ref 11.5–15.5)
WBC: 6.2 10*3/uL (ref 4.0–10.5)

## 2021-01-08 LAB — COMPREHENSIVE METABOLIC PANEL
ALT: 29 U/L (ref 0–53)
AST: 21 U/L (ref 0–37)
Albumin: 4.9 g/dL (ref 3.5–5.2)
Alkaline Phosphatase: 46 U/L (ref 39–117)
BUN: 24 mg/dL — ABNORMAL HIGH (ref 6–23)
CO2: 26 mEq/L (ref 19–32)
Calcium: 10.1 mg/dL (ref 8.4–10.5)
Chloride: 103 mEq/L (ref 96–112)
Creatinine, Ser: 1.44 mg/dL (ref 0.40–1.50)
GFR: 56.65 mL/min — ABNORMAL LOW (ref >60.00)
Glucose, Bld: 236 mg/dL — ABNORMAL HIGH (ref 70–99)
Potassium: 4.3 mEq/L (ref 3.5–5.1)
Sodium: 140 mEq/L (ref 135–145)
Total Bilirubin: 0.6 mg/dL (ref 0.2–1.2)
Total Protein: 7.6 g/dL (ref 6.0–8.3)

## 2021-01-08 LAB — POC URINALSYSI DIPSTICK (AUTOMATED)
Bilirubin, UA: NEGATIVE
Blood, UA: POSITIVE
Glucose, UA: POSITIVE — AB
Ketones, UA: NEGATIVE
Leukocytes, UA: NEGATIVE
Nitrite, UA: NEGATIVE
Protein, UA: POSITIVE — AB
Spec Grav, UA: 1.015 (ref 1.010–1.025)
Urobilinogen, UA: 0.2 E.U./dL
pH, UA: 5 (ref 5.0–8.0)

## 2021-01-08 LAB — HEMOGLOBIN A1C: Hgb A1c MFr Bld: 9.2 % — ABNORMAL HIGH (ref 4.6–6.5)

## 2021-01-08 MED ORDER — HYDROCODONE-ACETAMINOPHEN 5-325 MG PO TABS: 1.0000 | ORAL_TABLET | Freq: Four times a day (QID) | ORAL | 0 refills | Status: AC | PRN

## 2021-01-08 NOTE — Addendum Note (Signed)
Addended by: Maximino Sarin on: 01/08/2021 02:00 PM   Modules accepted: Orders

## 2021-01-08 NOTE — Progress Notes (Signed)
Subjective:    Patient ID: Joshua Skinner, male    DOB: 12/21/1970, 50 y.o.   MRN: [id number]  HPI  Pt in for follow up.  I reviewed May, 2022 note. Pt states he did see GI MD but had to delay getting the EGD and colonoscopy. Reviewed that note and replaced referral.   Pt states on Thursday he started to have appearance of possible blood in urine. Also some left cva area pain. When he drinks or eats will have left cva and left lower quadrant area pain. Pt states he is hydrating with water and gatorade zero.  Pt pain level about 5-6/10  Pt is planning to head out today to travel to [place].    Pt last a1c 4 months ago was 7.8. Pt sees Dr. Lonzo Cloud.Pt is on metformin and farxiga.   All the sudden today he anxious today. Also 2 weeks ago had a lot of anxiety going in elevator in only 2 story hotel. He never had that before. Also some recent anxiety driving. He is long haul truck driver   Rt hip pain for about 6 weeks. Pain worse sitting. 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   History of htn. Pt bp is high today. Pt is on amlodipine 5 mg daily.   Review of Systems  Constitutional:  Negative for chills, fatigue and fever.  Respiratory:  Negative for cough, chest tightness, shortness of breath and wheezing.   Cardiovascular:  Negative for chest pain and palpitations.  Gastrointestinal:  Negative for abdominal pain and blood in stool.  Genitourinary:  Positive for flank pain. Negative for dysuria, frequency, hematuria, penile pain, scrotal swelling, testicular pain and urgency.       Gross blood on Thursday. Over weekend appeared to clear up.  Musculoskeletal:  Negative for back pain, gait problem and joint swelling.  Skin:  Negative for rash.    Past Medical History:  Diagnosis Date   Anxiety    Arthritis    Asthma    Diabetes mellitus without complication (HCC)    Hemorrhoids    Hyperlipidemia    Hypertension      Social History   Socioeconomic History   Marital status:  Married    Spouse name: Not on file   Number of children: 3   Years of education: Not on file   Highest education level: Not on file  Occupational History   Occupation: Truck Hospital doctor  Tobacco Use   Smoking status: Never   Smokeless tobacco: Never  Vaping Use   Vaping Use: Never used  Substance and Sexual Activity   Alcohol use: Never   Drug use: Never   Sexual activity: Yes  Other Topics Concern   Not on file  Social History Narrative   ** Merged History Encounter **       Social Determinants of Health   Financial Resource Strain: Not on file  Food Insecurity: Not on file  Transportation Needs: Not on file  Physical Activity: Not on file  Stress: Not on file  Social Connections: Not on file  Intimate Partner Violence: Not on file    Past Surgical History:  Procedure Laterality Date   COLONOSCOPY  08/05/2008   Colon polyp sttus post polypectomy. Small internal hemorrhoids (most likely etiology of bright red blood per rectum) Rare diverticula in the sigmoid colon.    COLONOSCOPY  08/10/2015   Dr. Noe Gens. [facility], Internal hemorrhoids are noted, inadequate prep, limeted view , Repeat in 5 years  ESOPHAGOGASTRODUODENOSCOPY  06/25/2019   Dr.Shabena Shahid, [facility]. Irregular Z-line, biopsied, Gastritis, biopisied, hiatal hernia   HERNIA REPAIR Bilateral    ingunial    Family History  Problem Relation Age of Onset   Diabetes Mother    Irritable bowel syndrome Mother    Cirrhosis Mother    Prostate cancer Father    Diabetes Father    Kidney disease Father    Diabetes Sister    Cirrhosis Sister    Pancreatic cancer Neg Hx    Colon cancer Neg Hx    Stomach cancer Neg Hx    Esophageal cancer Neg Hx     Allergies  Allergen Reactions   Vicks Nyquil Cough [Doxylamine-Dm]     insomnia    Current Outpatient Medications on File Prior to Visit  Medication Sig Dispense Refill   allopurinol (ZYLOPRIM) 300 MG tablet Take 300 mg by mouth  daily.     amLODipine (NORVASC) 5 MG tablet Take 1 tablet (5 mg total) by mouth daily. 30 tablet 3   aspirin 81 MG chewable tablet Chew 81 mg by mouth daily.     atorvastatin (LIPITOR) 40 MG tablet Take 1 tablet by mouth once daily 90 tablet 0   dapagliflozin propanediol (FARXIGA) 5 MG TABS tablet Take 1 tablet (5 mg total) by mouth daily. 30 tablet 4   lisinopril (ZESTRIL) 20 MG tablet Take 1 tablet by mouth once daily 90 tablet 0   metFORMIN (GLUCOPHAGE-XR) 500 MG 24 hr tablet Take 1 tablet (500 mg total) by mouth in the morning and at bedtime. 180 tablet 1   omeprazole (PRILOSEC) 40 MG capsule Take 1 capsule by mouth daily as needed.     ondansetron (ZOFRAN ODT) 4 MG disintegrating tablet Take 1 tablet (4 mg total) by mouth every 8 (eight) hours as needed for nausea or vomiting. 20 tablet 0   sildenafil (VIAGRA) 50 MG tablet Take 1 tablet (50 mg total) by mouth daily as needed for erectile dysfunction. 10 tablet 0   SUMAtriptan (IMITREX) 20 MG/ACT nasal spray MAY REPEAT IN 2 HOURS IF HEADACHE PERSISTS OR RECURS 1 each 0   tamsulosin (FLOMAX) 0.4 MG CAPS capsule Take 1 capsule (0.4 mg total) by mouth at bedtime. 30 capsule 0   No current facility-administered medications on file prior to visit.    BP (!) 158/90   Pulse 72   Temp 98.5 F (36.9 C)   Resp 18   Ht 5\' 7"  (1.702 m)   Wt 200 lb 3.2 oz (90.8 kg)   SpO2 99%   BMI 31.36 kg/m       Objective:   Physical Exam  General Mental Status- Alert. General Appearance- Not in acute distress.  But does appear moderately uncomfortable.  Skin General: Color- Normal Color. Moisture- Normal Moisture.  Neck Carotid Arteries- Normal color. Moisture- Normal Moisture. No carotid bruits. No JVD.  Chest and Lung Exam Auscultation: Breath Sounds:-Normal.  Cardiovascular Auscultation:Rythm- Regular. Murmurs & Other Heart Sounds:Auscultation of the heart reveals- No Murmurs.  Abdomen Inspection:-Inspeection  Normal. Palpation/Percussion:Note:No mass. Palpation and Percussion of the abdomen reveal-.  Moderate left flank and left lower quadrant tender, Non Distended + BS, no rebound or guarding.  Back-mild to moderate left CVA tenderness to palpation.   Neurologic Cranial Nerve exam:- CN III-XII intact(No nystagmus), symmetric smile. Strength:- 5/5 equal and symmetric strength both upper and lower extremities.       Assessment & Plan:   Patient Instructions  Recent left CVA pain, hematuria,  renal colic causing concern for recurrent kidney stone.  Note on review history of kidney stones in the past.  Will get CBC, CMP and placed order for CT renal stone study.  The study will likely need to be prior authorized.  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  Concerned that you might need to be using narcotic for severe pain and might need to be on antibiotic potentially as well.  Right hip pain for several months worse when driving.  Go ahead and get right hip x-ray.  History of colitis and pancreatitis.  Reviewed past note in May.  Unfortunately were not able to get the recommended studies at that time.  So placed order to see GI MD/Dr. Chales Abrahams again.  Follow-up in 7 to 10 days or sooner if needed.  We will go ahead and prescribe Norco.  Rx advisement given.  Esperanza Richters, PA-C   Time spent with patient today was  43 minutes which consisted of chart revdiew, discussing diagnosis, work up treatment and documentation.

## 2021-01-08 NOTE — Patient Instructions (Addendum)
Recent left CVA pain, hematuria, renal colic causing concern for recurrent kidney stone.  Note on review history of kidney stones in the past.  Will get CBC, CMP and placed order for CT renal stone study.  The study will likely need to be prior authorized.  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  Concerned that you might need to be using narcotic for severe pain and might need to be on antibiotic potentially as well.  Right hip pain for several months worse when driving.  Go ahead and get right hip x-ray.  History of colitis and pancreatitis.  Reviewed past note in May.  Unfortunately were not able to get the recommended studies at that time.  So placed order to see GI MD/Dr. Chales Abrahams again.  Hypertension.  Blood pressure elevated today.  Think BP elevation is due to pain.  If blood pressure not coming down we will ask to increase amlodipine to 10 mg daily.  Diabetes.  Will get A1c today.  Continue metformin and Farxiga.  Past result of A1c to patient's endocrinologist so he can get scheduled for follow-up.  Follow-up in 7 to 10 days or sooner if needed.  We will go ahead and prescribe Norco.  Rx advisement given.

## 2021-01-09 ENCOUNTER — Encounter: Payer: Self-pay | Admitting: Medical

## 2021-01-09 ENCOUNTER — Telehealth: Payer: Self-pay | Admitting: Medical

## 2021-01-09 DIAGNOSIS — R319 Hematuria, unspecified: Secondary | ICD-10-CM

## 2021-01-09 DIAGNOSIS — N200 Calculus of kidney: Secondary | ICD-10-CM

## 2021-01-09 LAB — URINE CULTURE
MICRO NUMBER:: 12481891
Result:: NO GROWTH
SPECIMEN QUALITY:: ADEQUATE

## 2021-01-09 MED ORDER — TAMSULOSIN HCL 0.4 MG PO CAPS: 0.4000 mg | ORAL_CAPSULE | Freq: Every day | ORAL | 0 refills | Status: DC

## 2021-01-09 NOTE — Telephone Encounter (Signed)
Pt can't remember which urologist he saw in the past. So he is leaving it up to me provided insurance accepted by uroloogist. So would you try [facility] [address].

## 2021-01-09 NOTE — Telephone Encounter (Signed)
Opened to refer. 

## 2021-01-10 ENCOUNTER — Other Ambulatory Visit: Payer: Self-pay | Admitting: Urology

## 2021-01-10 ENCOUNTER — Encounter (HOSPITAL_BASED_OUTPATIENT_CLINIC_OR_DEPARTMENT_OTHER): Payer: Self-pay | Admitting: Urology

## 2021-01-11 ENCOUNTER — Other Ambulatory Visit: Payer: Self-pay

## 2021-01-11 ENCOUNTER — Encounter (HOSPITAL_BASED_OUTPATIENT_CLINIC_OR_DEPARTMENT_OTHER): Payer: Self-pay | Admitting: Urology

## 2021-01-11 NOTE — Progress Notes (Signed)
Spoke w/ via phone for pre-op interview---pt Lab needs dos----  I stat, ekg             Lab results------none COVID test -----patient states asymptomatic no test needed Arrive at -------830 am 01-17-2021 NPO after MN NO Solid Food.  Clear liquids from MN until---730 am Med rec completed Medications to take morning of surgery -----amlodipine, allopurinol, omeprazole, hydrocodone prn, tamsulosin, albuterol inhaler prn/bring inhaler Diabetic medication -----none day of surgery Patient instructed no nail polish to be worn day of surgery Patient instructed to bring photo id and insurance card day of surgery Patient aware to have Driver (ride ) / caregiver    for 24 hours after surgery wife Joshua Skinner Patient Special Instructions -----none Pre-Op special Istructions -----none Patient verbalized understanding of instructions that were given at this phone interview. Patient denies shortness of breath, chest pain, fever, cough at this phone interview.

## 2021-01-17 ENCOUNTER — Ambulatory Visit (HOSPITAL_BASED_OUTPATIENT_CLINIC_OR_DEPARTMENT_OTHER): Admission: RE | Admit: 2021-01-17 | Payer: 59 | Source: Home / Self Care | Admitting: Urology

## 2021-01-17 HISTORY — DX: Personal history of urinary calculi: Z87.442

## 2021-01-17 HISTORY — DX: Gastro-esophageal reflux disease without esophagitis: K21.9

## 2021-01-17 HISTORY — DX: Gout, unspecified: M10.9

## 2021-01-17 SURGERY — CYSTOSCOPY/URETEROSCOPY/HOLMIUM LASER/STENT PLACEMENT
Anesthesia: General | Laterality: Left

## 2021-01-29 ENCOUNTER — Ambulatory Visit (INDEPENDENT_AMBULATORY_CARE_PROVIDER_SITE_OTHER): Payer: 59 | Admitting: Internal Medicine

## 2021-01-29 ENCOUNTER — Other Ambulatory Visit: Payer: Self-pay

## 2021-01-29 ENCOUNTER — Encounter: Payer: Self-pay | Admitting: Internal Medicine

## 2021-01-29 VITALS — BP 140/82 | HR 72 | Ht 67.0 in | Wt 198.4 lb

## 2021-01-29 DIAGNOSIS — E1149 Type 2 diabetes mellitus with other diabetic neurological complication: Secondary | ICD-10-CM | POA: Diagnosis not present

## 2021-01-29 DIAGNOSIS — E119 Type 2 diabetes mellitus without complications: Secondary | ICD-10-CM

## 2021-01-29 DIAGNOSIS — Z23 Encounter for immunization: Secondary | ICD-10-CM

## 2021-01-29 DIAGNOSIS — E1165 Type 2 diabetes mellitus with hyperglycemia: Secondary | ICD-10-CM

## 2021-01-29 HISTORY — DX: Type 2 diabetes mellitus with hyperglycemia: E11.65

## 2021-01-29 HISTORY — DX: Type 2 diabetes mellitus without complications: E11.9

## 2021-01-29 HISTORY — DX: Encounter for immunization: Z23

## 2021-01-29 LAB — GLUCOSE, POCT (MANUAL RESULT ENTRY): POC Glucose: 190 mg/dl — AB (ref 70–99)

## 2021-01-29 MED ORDER — FREESTYLE LIBRE 2 SENSOR MISC: 1.0000 | 11 refills | Status: DC

## 2021-01-29 MED ORDER — GLIPIZIDE 5 MG PO TABS: 5.0000 mg | ORAL_TABLET | Freq: Every day | ORAL | 3 refills | Status: DC

## 2021-01-29 MED ORDER — FREESTYLE LIBRE 14 DAY SENSOR MISC: 3 refills | Status: DC

## 2021-01-29 MED ORDER — DAPAGLIFLOZIN PROPANEDIOL 10 MG PO TABS: 10.0000 mg | ORAL_TABLET | Freq: Every day | ORAL | 1 refills | Status: DC

## 2021-01-29 NOTE — Progress Notes (Signed)
Name: Joshua Skinner  MRN/ DOB: [id number], 09-16-1970   Age/ Sex: 50 y.o., male    PCP: Marisue Brooklyn   Reason for Endocrinology Evaluation: Type 2 Diabetes Mellitus     Date of Initial Endocrinology Visit: 08/14/2020    PATIENT IDENTIFIER: Joshua Skinner is a 50 y.o. male with a past medical history of HTN, T2 DM, and dyslipidemia. The patient presented for initial endocrinology clinic visit on 08/14/2020 for consultative assistance with his diabetes management.    HPI: [name] is accompanied by his spouse and daughter     DIABETIC HISTORY:  [name] was diagnosed with DM in 2012, he has been on Glimepiride, OZempic/Trulicity - bad diarrhea . His hemoglobin A1c has ranged from 7.4% in 2022, peaking at 8.1% in 2021.   On his initial visit to our clinic he had an A1c of 7.4 % , he was on Metformin and we started [place]   He is a truck driver  SUBJECTIVE:   During the last visit (08/14/2020): A1c 7.4% We switched metformin to XR and started [place]   Today (01/29/2021): [name] is here for a follow up on diabetes management.He has not been here in 5 months, pt missed his 3 month follow up. He is accompanied by his spouse today.  Since his last visit his A1c was increased from 7.4 % to 9.2% , he denies any dietary changes ?  He does not check glucose on regular basis.  He continues with abdominal pain  and vomiting. Occasional sugar sweetened beverages. Has a pending colonoscopy . The patient has not had hypoglycemic episodes since the last clinic visit,      HOME DIABETES REGIMEN: Metformin 500 mg XR BID  Farxiga 5 mg daily    Statin: yes ACE-I/ARB: yes  Prior Diabetic Education: no   METER DOWNLOAD SUMMARY: Did not bring    DIABETIC COMPLICATIONS: Microvascular complications:   Denies: CKD , retinopathy, neuropathy  Last eye exam: Completed 2022  Macrovascular complications:   Denies: CAD, PVD, CVA   PAST HISTORY: Past Medical History:   Past Medical History:  Diagnosis Date   Anxiety    Arthritis    Asthma    GERD (gastroesophageal reflux disease)    Gout    last flare up 2020 per pt   Hemorrhoids    History of COVID-19 03/22/2019   fever, body aches chills x 3 weeks monoclonal iv antibodies given all symptoms resolved   History of COVID-19 07/2019   mild symtpoms all symptosm resolved   Hyperlipidemia    Hypertension    left ureteral stone    Type 2 DM    Past Surgical History:  Past Surgical History:  Procedure Laterality Date   COLONOSCOPY  08/05/2008   Colon polyp sttus post polypectomy. Small internal hemorrhoids (most likely etiology of bright red blood per rectum) Rare diverticula in the sigmoid colon.    COLONOSCOPY  08/10/2015   Dr. Noe Gens. [facility], Internal hemorrhoids are noted, inadequate prep, limeted view , Repeat in 5 years   ESOPHAGOGASTRODUODENOSCOPY  06/25/2019   Dr.Shabena Shahid, [facility]. Irregular Z-line, biopsied, Gastritis, biopisied, hiatal hernia   HERNIA REPAIR Bilateral    inguinal yrs agp per pt on 01-11-2021    Social History:  reports that he has never smoked. He has never used smokeless tobacco. He reports that he does not drink alcohol and does not use drugs. Family History:  Family History  Problem Relation Age of Onset  Diabetes Mother    Irritable bowel syndrome Mother    Cirrhosis Mother    Prostate cancer Father    Diabetes Father    Kidney disease Father    Diabetes Sister    Cirrhosis Sister    Pancreatic cancer Neg Hx    Colon cancer Neg Hx    Stomach cancer Neg Hx    Esophageal cancer Neg Hx      HOME MEDICATIONS: Allergies as of 01/29/2021       Reactions   Vicks Nyquil Cough [doxylamine-dm]    insomnia        Medication List        Accurate as of January 29, 2021  8:29 AM. If you have any questions, ask your nurse or doctor.          STOP taking these medications    metFORMIN 500 MG 24 hr  tablet Commonly known as: GLUCOPHAGE-XR Stopped by: Scarlette Shorts, MD       TAKE these medications    Albuterol Sulfate 108 (90 Base) MCG/ACT Aepb Commonly known as: PROAIR RESPICLICK Inhale 2 puffs into the lungs as needed.   allopurinol 300 MG tablet Commonly known as: ZYLOPRIM Take 300 mg by mouth daily.   amLODipine 5 MG tablet Commonly known as: NORVASC Take 1 tablet (5 mg total) by mouth daily.   aspirin 81 MG chewable tablet Chew 81 mg by mouth daily.   atorvastatin 40 MG tablet Commonly known as: LIPITOR Take 1 tablet by mouth once daily What changed:  how much to take how to take this when to take this   dapagliflozin propanediol 10 MG Tabs tablet Commonly known as: Farxiga Take 1 tablet (10 mg total) by mouth daily before breakfast. What changed:  medication strength how much to take when to take this Changed by: Scarlette Shorts, MD   FreeStyle Libre 2 Sensor Misc 1 Device by Does not apply route every 14 (fourteen) days. Started by: Scarlette Shorts, MD   glipiZIDE 5 MG tablet Commonly known as: GLUCOTROL Take 1 tablet (5 mg total) by mouth daily before breakfast. Started by: Scarlette Shorts, MD   HYDROcodone-acetaminophen 5-325 MG tablet Commonly known as: Norco Take 1 tablet by mouth every 6 (six) hours as needed for moderate pain.   lisinopril 20 MG tablet Commonly known as: ZESTRIL Take 1 tablet by mouth once daily   omeprazole 40 MG capsule Commonly known as: PRILOSEC Take 1 capsule by mouth daily as needed.   ondansetron 4 MG disintegrating tablet Commonly known as: Zofran ODT Take 1 tablet (4 mg total) by mouth every 8 (eight) hours as needed for nausea or vomiting.   sildenafil 50 MG tablet Commonly known as: Viagra Take 1 tablet (50 mg total) by mouth daily as needed for erectile dysfunction.   SUMAtriptan 20 MG/ACT nasal spray Commonly known as: IMITREX MAY REPEAT IN 2 HOURS IF HEADACHE PERSISTS OR  RECURS   tamsulosin 0.4 MG Caps capsule Commonly known as: FLOMAX Take 1 capsule (0.4 mg total) by mouth at bedtime. What changed: when to take this         ALLERGIES: Allergies  Allergen Reactions   Vicks Nyquil Cough [Doxylamine-Dm]     insomnia     REVIEW OF SYSTEMS: A comprehensive ROS was conducted with the patient and is negative except as per HPI \   OBJECTIVE:   VITAL SIGNS: BP 140/82 (BP Location: Right Arm, Patient Position: Sitting, Cuff Size: Small)  Pulse 72   Ht 5\' 7"  (1.702 m)   Wt 198 lb 6.4 oz (90 kg)   SpO2 98%   BMI 31.07 kg/m    PHYSICAL EXAM:  General: Pt appears well and is in NAD  Neck: General: Supple without adenopathy or carotid bruits. Thyroid: Thyroid size normal.  No goiter or nodules appreciated.   Lungs: Clear with good BS bilat with no rales, rhonchi, or wheezes  Heart: RRR with normal S1 and S2 and no gallops; no murmurs; no rub  Abdomen: Normoactive bowel sounds, soft, nontender, without masses or organomegaly palpable  Extremities:  Lower extremities - No pretibial edema. No lesions.  Skin: Normal texture and temperature to palpation. No rash noted. No Acanthosis nigricans/skin tags. No lipohypertrophy.  Neuro: MS is good with appropriate affect, pt is alert and Ox3    DM foot exam: 08/14/2020  The skin of the feet is intact without sores or ulcerations. The pedal pulses are 2+ on right and 2+ on left. The sensation is more sensitive  to a screening 5.07, 10 gram monofilament at the great toes   DATA REVIEWED:  Lab Results  Component Value Date   HGBA1C 9.2 (H) 01/08/2021   HGBA1C 7.8 (A) 08/14/2020   HGBA1C 7.4 (H) 05/22/2020   Lab Results  Component Value Date   LDLCALC 42 01/28/2020   CREATININE 1.44 01/08/2021    Lab Results  Component Value Date   CHOL 107 05/22/2020   HDL 31.40 (L) 05/22/2020   LDLCALC 42 01/28/2020   LDLDIRECT 49.0 05/22/2020   TRIG 245.0 (H) 05/22/2020   CHOLHDL 3 05/22/2020         ASSESSMENT / PLAN / RECOMMENDATIONS:   1) Type 2 Diabetes Mellitus, Poorly  controlled, With neuropathic complications - Most recent A1c of 9.2 %. Goal A1c < 7.0 %.    Poorly controlled diabetes due to dietary indiscretions, as well as reducing metfomrin on last visit.  Trulicity/Ozempic caused severe diarrhea Januvia cost prohibitive He continues with GI symptoms. Will stop Metformin altogether Will increase 05/24/2020 Glipizide, he had hypoglycemia with Glimepiride, we discussed safe use of SU , and to take it within 15 minutes of eating first meal of the day   Discussed importance of low carb diet and avoiding sugar-sweetened beverages  Freestyle libre sent   MEDICATIONS: Stop metformin  Start gLIPIZIDE 5 MG DAILY  Increase Farxiga 10 mg 1 tablet daily  EDUCATION / INSTRUCTIONS: BG monitoring instructions: Patient is instructed to check his blood sugars 1 times a day, fasting. Call [facility] if: BG persistently < 70 I reviewed the Rule of 15 for the treatment of hypoglycemia in detail with the patient. Literature supplied.   2) Diabetic complications:  Eye: Does not have known diabetic retinopathy.  Neuro/ Feet: Does  have known diabetic peripheral neuropathy. Renal: Patient does not have known baseline CKD. He is on an ACEI/ARB at present  3) Dyslipidemia : Patient is on atorvastatin 40 mg daily.  We discussed cardiovascular benefits of statins and I have encouraged compliance Tg above goal , LDL at goal     Medication Continue atorvastatin 40 mg daily     Follow-up in 3 months    Signed electronically by: [employer], MD  [facility]  [facility] [address] [place] [place], [place] Phone: [phone] FAX: [phone]   CC: [phone] Marisue Brooklyn [facility] [address] [facility] [place] Phone: [phone]  Fax:  [phone]    Return to Endocrinology clinic as  below: Future Appointments  Date Time Provider Department Center  06/01/2021  7:30 AM Kamaile Zachow, Konrad Dolores, MD [id number] None

## 2021-01-29 NOTE — Patient Instructions (Signed)
-   Stop Metformin  - Start Glipizide 5 mg , 1 tablet before Breakfast  - Increase Farxiga 10 mg ,1 tablet  every morning     HOW TO TREAT LOW BLOOD SUGARS (Blood sugar LESS THAN 70 MG/DL) Please follow the RULE OF 15 for the treatment of hypoglycemia treatment (when your (blood sugars are less than 70 mg/dL)   STEP 1: Take 15 grams of carbohydrates when your blood sugar is low, which includes:  3-4 GLUCOSE TABS  OR 3-4 OZ OF JUICE OR REGULAR SODA OR ONE TUBE OF GLUCOSE GEL    STEP 2: RECHECK blood sugar in 15 MINUTES STEP 3: If your blood sugar is still low at the 15 minute recheck --> then, go back to STEP 1 and treat AGAIN with another 15 grams of carbohydrates.

## 2021-02-04 ENCOUNTER — Other Ambulatory Visit: Payer: Self-pay | Admitting: Gastroenterology

## 2021-02-05 ENCOUNTER — Other Ambulatory Visit: Payer: Self-pay | Admitting: Gastroenterology

## 2021-02-10 ENCOUNTER — Encounter: Payer: Self-pay | Admitting: Medical

## 2021-02-10 ENCOUNTER — Other Ambulatory Visit: Payer: Self-pay | Admitting: Medical

## 2021-02-13 ENCOUNTER — Other Ambulatory Visit: Payer: Self-pay | Admitting: Medical

## 2021-02-23 DIAGNOSIS — N179 Acute kidney failure, unspecified: Secondary | ICD-10-CM | POA: Insufficient documentation

## 2021-02-23 HISTORY — DX: Acute kidney failure, unspecified: N17.9

## 2021-02-26 ENCOUNTER — Telehealth: Payer: Self-pay | Admitting: *Deleted

## 2021-02-26 NOTE — Telephone Encounter (Signed)
Who Is Calling Patient / Member / Family / Caregiver Call Type Triage / Clinical Caller Name Dyon Rotert Relationship To Patient Spouse Return Phone Number [phone] (Primary) Chief Complaint Vomiting Reason for Call Symptomatic / Request for Health Information Initial Comment Caller states her husband has diabetes and he is having episodes of vomiting and diarrhea. She states it stops with fluids and is wondering if she can get IVs without the ER. Translation No Nurse Assessment Nurse: Thad Ranger, RN, Yehuda Mao Date/Time (Eastern Time): 02/23/2021 10:40:43 AM Confirm and document reason for call. If symptomatic, describe symptoms. ---Caller states her husband has diabetes and he is having episodes of vomiting and diarrhea. If he tries to take a sip of gatorade he vomits. Blood sugar is 159.  Disp. Time Lamount Cohen Time) Disposition Final User 02/23/2021 10:45:42 AM Go to ED Now (or PCP triage) Yes Thad Ranger, RN, Yehuda Mao

## 2021-02-26 NOTE — Telephone Encounter (Signed)
Spoke with pt , he stated he is doing okay , and he did go to the ER and did receive IV fluids.

## 2021-03-05 ENCOUNTER — Other Ambulatory Visit: Payer: Self-pay | Admitting: Medical

## 2021-03-12 ENCOUNTER — Other Ambulatory Visit: Payer: Self-pay | Admitting: Medical

## 2021-04-04 ENCOUNTER — Other Ambulatory Visit: Payer: Self-pay | Admitting: Medical

## 2021-04-04 ENCOUNTER — Encounter: Payer: Self-pay | Admitting: Internal Medicine

## 2021-04-06 ENCOUNTER — Other Ambulatory Visit (HOSPITAL_COMMUNITY): Payer: Self-pay

## 2021-04-06 ENCOUNTER — Telehealth: Payer: Self-pay

## 2021-04-06 ENCOUNTER — Telehealth: Payer: Self-pay | Admitting: Pharmacy Technician

## 2021-04-06 NOTE — Telephone Encounter (Signed)
PA has been submitted.

## 2021-04-06 NOTE — Telephone Encounter (Signed)
Patient Advocate Encounter  Prior Authorization for Farxiga 10mg  tabs has been approved.    PA#  Effective dates: 04/06/21 through 04/06/22  Per Test Claim Patients co-pay is $15   Spoke with Pharmacy to Process.  Patient Advocate Fax:  [phone]

## 2021-04-06 NOTE — Telephone Encounter (Signed)
Farxiga needs a PA  

## 2021-04-06 NOTE — Telephone Encounter (Signed)
Patient Advocate Encounter  Received notification from [facility] OFFICE that prior authorization for [facility] 10MG  is required.   PA submitted on 1.6.23 Key [id number] Status is pending   [facility] will continue to follow  [id number], CPhT Patient Advocate [facility] Phone: [phone] Fax:  [phone]

## 2021-04-23 ENCOUNTER — Other Ambulatory Visit: Payer: Self-pay | Admitting: Medical

## 2021-05-05 ENCOUNTER — Encounter: Payer: Self-pay | Admitting: Medical

## 2021-05-05 ENCOUNTER — Other Ambulatory Visit: Payer: Self-pay | Admitting: Medical

## 2021-05-11 DIAGNOSIS — Z713 Dietary counseling and surveillance: Secondary | ICD-10-CM | POA: Diagnosis not present

## 2021-05-11 DIAGNOSIS — Z03818 Encounter for observation for suspected exposure to other biological agents ruled out: Secondary | ICD-10-CM | POA: Diagnosis not present

## 2021-05-11 DIAGNOSIS — J020 Streptococcal pharyngitis: Secondary | ICD-10-CM | POA: Diagnosis not present

## 2021-05-11 DIAGNOSIS — J029 Acute pharyngitis, unspecified: Secondary | ICD-10-CM | POA: Diagnosis not present

## 2021-05-11 DIAGNOSIS — R6883 Chills (without fever): Secondary | ICD-10-CM | POA: Diagnosis not present

## 2021-05-11 DIAGNOSIS — E119 Type 2 diabetes mellitus without complications: Secondary | ICD-10-CM | POA: Diagnosis not present

## 2021-05-11 DIAGNOSIS — Z20822 Contact with and (suspected) exposure to covid-19: Secondary | ICD-10-CM | POA: Diagnosis not present

## 2021-05-12 ENCOUNTER — Other Ambulatory Visit: Payer: Self-pay | Admitting: Medical

## 2021-05-15 ENCOUNTER — Other Ambulatory Visit: Payer: Self-pay | Admitting: Medical

## 2021-06-01 ENCOUNTER — Ambulatory Visit: Payer: Self-pay | Admitting: Internal Medicine

## 2021-06-11 ENCOUNTER — Encounter: Payer: Self-pay | Admitting: Internal Medicine

## 2021-06-11 ENCOUNTER — Ambulatory Visit: Payer: 59 | Admitting: Internal Medicine

## 2021-06-11 ENCOUNTER — Other Ambulatory Visit: Payer: Self-pay

## 2021-06-11 VITALS — BP 140/80 | HR 71 | Ht 67.0 in | Wt 209.0 lb

## 2021-06-11 DIAGNOSIS — R739 Hyperglycemia, unspecified: Secondary | ICD-10-CM | POA: Diagnosis not present

## 2021-06-11 DIAGNOSIS — E785 Hyperlipidemia, unspecified: Secondary | ICD-10-CM

## 2021-06-11 DIAGNOSIS — E1165 Type 2 diabetes mellitus with hyperglycemia: Secondary | ICD-10-CM | POA: Diagnosis not present

## 2021-06-11 LAB — LIPID PANEL
Cholesterol: 124 mg/dL (ref 0–200)
HDL: 32.2 mg/dL — ABNORMAL LOW (ref >39.00)
NonHDL: 92.15
Total CHOL/HDL Ratio: 4
Triglycerides: 275 mg/dL — ABNORMAL HIGH (ref 0.0–149.0)
VLDL: 55 mg/dL — ABNORMAL HIGH (ref 0.0–40.0)

## 2021-06-11 LAB — MICROALBUMIN / CREATININE URINE RATIO
Creatinine,U: 79.2 mg/dL
Microalb Creat Ratio: 6.8 mg/g (ref 0.0–30.0)
Microalb, Ur: 5.4 mg/dL — ABNORMAL HIGH (ref 0.0–1.9)

## 2021-06-11 LAB — BASIC METABOLIC PANEL
BUN: 23 mg/dL (ref 6–23)
CO2: 25 mEq/L (ref 19–32)
Calcium: 9.8 mg/dL (ref 8.4–10.5)
Chloride: 105 mEq/L (ref 96–112)
Creatinine, Ser: 1.34 mg/dL (ref 0.40–1.50)
GFR: 61.57 mL/min (ref >60.00)
Glucose, Bld: 192 mg/dL — ABNORMAL HIGH (ref 70–99)
Potassium: 4.3 mEq/L (ref 3.5–5.1)
Sodium: 140 mEq/L (ref 135–145)

## 2021-06-11 LAB — LDL CHOLESTEROL, DIRECT: Direct LDL: 56 mg/dL

## 2021-06-11 MED ORDER — DAPAGLIFLOZIN PROPANEDIOL 10 MG PO TABS: 10.0000 mg | ORAL_TABLET | Freq: Every day | ORAL | 3 refills | Status: DC

## 2021-06-11 NOTE — Progress Notes (Signed)
Name: Joshua Skinner  MRN/ DOB: [id number], 10-Mar-1971   Age/ Sex: 51 y.o., male    PCP: Joshua Skinner, Edward, PA-C   Reason for Endocrinology Evaluation: Type 2 Diabetes Mellitus     Date of Initial Endocrinology Visit: 08/14/2020    PATIENT IDENTIFIER: Joshua Skinner is a 51 y.o. male with a past medical history of HTN, T2 DM, and dyslipidemia. The patient presented for initial endocrinology clinic visit on 08/14/2020 for consultative assistance with his diabetes management.    HPI: Joshua Skinner is accompanied by his spouse and daughter     DIABETIC HISTORY:  Joshua Skinner was diagnosed with DM in 2012, he has been on Glimepiride, OZempic/Trulicity - bad diarrhea . His hemoglobin A1c has ranged from 7.4% in 2022, peaking at 8.1% in 2021.   On his initial visit to our clinic he had an A1c of 7.4 % , he was on Metformin and we started [place]xiga   Stopped Metformin due to GI symptoms and started Glipizide 12/2020  He is a truck driver  SUBJECTIVE:   During the last visit (01/29/2021): A1c 9.2% Stopped Metformin , increased Farxiga and started glipizide   Today (01/29/2021): Joshua Skinner is here for a follow up on diabetes management. He is accompanied by his spouse today.  He checks glucose multiple times daily through CGM.  He has not been noted with any hypoglycemic episodes since his last visit to our clinic.  Has noted tingling ad cold sensation of feet , its uncomfortable , does not want gabapentin . Capsaicin cream did not hep    HOME DIABETES REGIMEN: Glipizide 5 mg daily  Farxiga 10 mg daily    Statin: yes ACE-I/ARB: yes  Prior Diabetic Education: no    CONTINUOUS GLUCOSE MONITORING RECORD INTERPRETATION    Dates of Recording: 2/28- 06/11/2021  Sensor description:freestyle libre   Results statistics:   CGM use % of time 44  Average and SD 169/28.3  Time in range  64      %  % Time Above 180 29  % Time above 250 7  % Time Below target 0      Glycemic patterns  summary: Bg's optimal overnight with worsening hyperglycemia after supper   Hyperglycemic episodes  postprandial    Hypoglycemic episodes occurred n/a  Overnight periods: trends down after supper       DIABETIC COMPLICATIONS: Microvascular complications:   Denies: CKD , retinopathy, neuropathy  Last eye exam: Completed 2022  Macrovascular complications:   Denies: CAD, PVD, CVA   PAST HISTORY: Past Medical History:  Past Medical History:  Diagnosis Date   Anxiety    Arthritis    Asthma    GERD (gastroesophageal reflux disease)    Gout    last flare up 2020 per pt   Hemorrhoids    History of COVID-19 03/22/2019   fever, body aches chills x 3 weeks monoclonal iv antibodies given all symptoms resolved   History of COVID-19 07/2019   mild symtpoms all symptosm resolved   Hyperlipidemia    Hypertension    left ureteral stone    Type 2 DM    Past Surgical History:  Past Surgical History:  Procedure Laterality Date   COLONOSCOPY  08/05/2008   Colon polyp sttus post polypectomy. Small internal hemorrhoids (most likely etiology of bright red blood per rectum) Rare diverticula in the sigmoid colon.    COLONOSCOPY  08/10/2015   [facility], Internal hemorrhoids are noted, inadequate prep, limeted view ,  Repeat in 5 years   ESOPHAGOGASTRODUODENOSCOPY  06/25/2019   Dr.Shabena Shahid, [facility]. Irregular Z-line, biopsied, Gastritis, biopisied, hiatal hernia   HERNIA REPAIR Bilateral    inguinal yrs agp per pt on 01-11-2021    Social History:  reports that he has never smoked. He has never used smokeless tobacco. He reports that he does not drink alcohol and does not use drugs. Family History:  Family History  Problem Relation Age of Onset   Diabetes Mother    Irritable bowel syndrome Mother    Cirrhosis Mother    Prostate cancer Father    Diabetes Father    Kidney disease Father    Diabetes Sister    Cirrhosis Sister     Pancreatic cancer Neg Hx    Colon cancer Neg Hx    Stomach cancer Neg Hx    Esophageal cancer Neg Hx      HOME MEDICATIONS: Allergies as of 06/11/2021       Reactions   Vicks Nyquil Cough [doxylamine-dm]    insomnia        Medication List        Accurate as of June 11, 2021  9:11 AM. If you have any questions, ask your nurse or doctor.          Albuterol Sulfate 108 (90 Base) MCG/ACT Aepb Commonly known as: PROAIR RESPICLICK Inhale 2 puffs into the lungs as needed.   allopurinol 300 MG tablet Commonly known as: ZYLOPRIM Take 300 mg by mouth daily.   amLODipine 5 MG tablet Commonly known as: NORVASC Take 1 tablet by mouth once daily   aspirin 81 MG chewable tablet Chew 81 mg by mouth daily.   atorvastatin 40 MG tablet Commonly known as: LIPITOR Take 1 tablet by mouth once daily   dapagliflozin propanediol 10 MG Tabs tablet Commonly known as: Farxiga Take 1 tablet (10 mg total) by mouth daily before breakfast.   FreeStyle Libre 2 Sensor Misc 1 Device by Does not apply route every 14 (fourteen) days.   FreeStyle Libre 14 Day Sensor Misc Change every 14 days   glipiZIDE 5 MG tablet Commonly known as: GLUCOTROL Take 1 tablet (5 mg total) by mouth daily before breakfast.   HYDROcodone-acetaminophen 5-325 MG tablet Commonly known as: Norco Take 1 tablet by mouth every 6 (six) hours as needed for moderate pain.   lisinopril 20 MG tablet Commonly known as: ZESTRIL Take 1 tablet (20 mg total) by mouth daily.   omeprazole 40 MG capsule Commonly known as: PRILOSEC Take 1 capsule by mouth daily as needed.   ondansetron 4 MG disintegrating tablet Commonly known as: Zofran ODT Take 1 tablet (4 mg total) by mouth every 8 (eight) hours as needed for nausea or vomiting.   sildenafil 50 MG tablet Commonly known as: VIAGRA TAKE 1 TABLET BY MOUTH ONCE DAILY AS NEEDED FOR ERECTILE DYSFUNCTION   SUMAtriptan 20 MG/ACT nasal spray Commonly known as:  IMITREX MAY REPEAT IN 2 HOURS IF HEADACHE PERSISTS OR RECURS   tamsulosin 0.4 MG Caps capsule Commonly known as: FLOMAX TAKE 1 CAPSULE BY MOUTH AT BEDTIME *OVERDUE  FOR  VISIT*         ALLERGIES: Allergies  Allergen Reactions   Vicks Nyquil Cough [Doxylamine-Dm]     insomnia     REVIEW OF SYSTEMS: A comprehensive ROS was conducted with the patient and is negative except as per HPI \   OBJECTIVE:   VITAL SIGNS: BP 140/80 (BP Location: Left Arm, Patient  Position: Sitting, Cuff Size: Small)    Pulse 71    Ht 5\' 7"  (1.702 m)    Wt 209 lb (94.8 kg)    SpO2 96%    BMI 32.73 kg/m    PHYSICAL EXAM:  General: Pt appears well and is in NAD  Neck: General: Supple without adenopathy or carotid bruits. Thyroid: Thyroid size normal.  No goiter or nodules appreciated.   Lungs: Clear with good BS bilat with no rales, rhonchi, or wheezes  Heart: RRR with normal S1 and S2 and no gallops; no murmurs; no rub  Abdomen: Normoactive bowel sounds, soft, nontender, without masses or organomegaly palpable  Extremities:  Lower extremities - No pretibial edema. No lesions.  Skin: Normal texture and temperature to palpation. No rash noted. No Acanthosis nigricans/skin tags. No lipohypertrophy.  Neuro: MS is good with appropriate affect, pt is alert and Ox3    DM foot exam: 06/11/2021  The skin of the feet is intact without sores or ulcerations. The pedal pulses are 1+ on right and 1+ on left. The sensation is decreased  to a screening 5.07, 10 gram monofilament B/L      DATA REVIEWED:  Lab Results  Component Value Date   HGBA1C 9.2 (H) 01/08/2021   HGBA1C 7.8 (A) 08/14/2020   HGBA1C 7.4 (H) 05/22/2020   Lab Results  Component Value Date   LDLCALC 42 01/28/2020   CREATININE 1.44 01/08/2021    Lab Results  Component Value Date   CHOL 107 05/22/2020   HDL 31.40 (L) 05/22/2020   LDLCALC 42 01/28/2020   LDLDIRECT 49.0 05/22/2020   TRIG 245.0 (H) 05/22/2020   CHOLHDL 3 05/22/2020         ASSESSMENT / PLAN / RECOMMENDATIONS:   1) Type 2 Diabetes Mellitus, Optimally  controlled, With neuropathic complications - Most recent A1c of 7.3 %. Goal A1c < 7.0 %.    Praised patient on improved glycemic control Trulicity/Ozempic caused severe diarrhea Januvia cost prohibitive Metformin stopped due to GI side effects.   In review of his CGM download, the patient has been noted with postprandial hyperglycemia, we discussed low-carb diet No changes today  MEDICATIONS:  Continue glipizide 5 MG DAILY  Continue Farxiga 10 mg 1 tablet daily  EDUCATION / INSTRUCTIONS: BG monitoring instructions: Patient is instructed to check his blood sugars 1 times a day, fasting. Call [facility] if: BG persistently < 70 I reviewed the Rule of 15 for the treatment of hypoglycemia in detail with the patient. Literature supplied.   2) Diabetic complications:  Eye: Does not have known diabetic retinopathy.  Neuro/ Feet: Does  have known diabetic peripheral neuropathy. Renal: Patient does not have known baseline CKD. He is on an ACEI/ARB at present    Follow-up in 4 months    Signed electronically by: 05/24/2020, MD  [facility]  [facility] [address] [place] [place], [place] Phone: [phone] FAX: [phone]   CC: [phone] Joshua Brooklyn [facility] [address] [facility] [place] Phone: [phone]  Fax: [phone]    Return to Endocrinology clinic as below: No future appointments.

## 2021-06-11 NOTE — Patient Instructions (Signed)
?-   Continue  Glipizide 5 mg , 1 tablet before Breakfast  ?- Continue Farxiga 10 mg ,1 tablet  every morning  ? ? ? ?HOW TO TREAT LOW BLOOD SUGARS (Blood sugar LESS THAN 70 MG/DL) ?Please follow the RULE OF 15 for the treatment of hypoglycemia treatment (when your (blood sugars are less than 70 mg/dL)  ? ?STEP 1: Take 15 grams of carbohydrates when your blood sugar is low, which includes:  ?3-4 GLUCOSE TABS  OR ?3-4 OZ OF JUICE OR REGULAR SODA OR ?ONE TUBE OF GLUCOSE GEL   ? ?STEP 2: RECHECK blood sugar in 15 MINUTES ?STEP 3: If your blood sugar is still low at the 15 minute recheck --> then, go back to STEP 1 and treat AGAIN with another 15 grams of carbohydrates. ? ?

## 2021-06-12 NOTE — Addendum Note (Signed)
Addended by: Scarlette Shorts on: 06/12/2021 04:51 PM ? ? Modules accepted: Level of Service ? ?

## 2021-06-17 ENCOUNTER — Other Ambulatory Visit: Payer: Self-pay | Admitting: Medical

## 2021-07-08 ENCOUNTER — Other Ambulatory Visit: Payer: Self-pay | Admitting: Medical

## 2021-07-20 ENCOUNTER — Other Ambulatory Visit: Payer: Self-pay | Admitting: Medical

## 2021-08-11 ENCOUNTER — Other Ambulatory Visit: Payer: Self-pay | Admitting: Medical

## 2021-08-28 ENCOUNTER — Encounter: Payer: Self-pay | Admitting: Medical

## 2021-08-28 ENCOUNTER — Ambulatory Visit: Payer: 59 | Admitting: Medical

## 2021-08-28 VITALS — BP 137/80 | HR 72 | Resp 18 | Ht 67.0 in | Wt 212.0 lb

## 2021-08-28 DIAGNOSIS — I10 Essential (primary) hypertension: Secondary | ICD-10-CM

## 2021-08-28 DIAGNOSIS — E785 Hyperlipidemia, unspecified: Secondary | ICD-10-CM

## 2021-08-28 DIAGNOSIS — N401 Enlarged prostate with lower urinary tract symptoms: Secondary | ICD-10-CM

## 2021-08-28 DIAGNOSIS — Z125 Encounter for screening for malignant neoplasm of prostate: Secondary | ICD-10-CM | POA: Diagnosis not present

## 2021-08-28 DIAGNOSIS — E1149 Type 2 diabetes mellitus with other diabetic neurological complication: Secondary | ICD-10-CM

## 2021-08-28 DIAGNOSIS — R972 Elevated prostate specific antigen [PSA]: Secondary | ICD-10-CM

## 2021-08-28 DIAGNOSIS — G629 Polyneuropathy, unspecified: Secondary | ICD-10-CM | POA: Diagnosis not present

## 2021-08-28 DIAGNOSIS — R350 Frequency of micturition: Secondary | ICD-10-CM

## 2021-08-28 LAB — COMPREHENSIVE METABOLIC PANEL
ALT: 26 U/L (ref 0–53)
AST: 19 U/L (ref 0–37)
Albumin: 4.7 g/dL (ref 3.5–5.2)
Alkaline Phosphatase: 44 U/L (ref 39–117)
BUN: 25 mg/dL — ABNORMAL HIGH (ref 6–23)
CO2: 25 mEq/L (ref 19–32)
Calcium: 9.7 mg/dL (ref 8.4–10.5)
Chloride: 104 mEq/L (ref 96–112)
Creatinine, Ser: 1.33 mg/dL (ref 0.40–1.50)
GFR: 62.04 mL/min (ref >60.00)
Glucose, Bld: 202 mg/dL — ABNORMAL HIGH (ref 70–99)
Potassium: 4.4 mEq/L (ref 3.5–5.1)
Sodium: 138 mEq/L (ref 135–145)
Total Bilirubin: 0.5 mg/dL (ref 0.2–1.2)
Total Protein: 7.1 g/dL (ref 6.0–8.3)

## 2021-08-28 LAB — LIPID PANEL
Cholesterol: 116 mg/dL (ref 0–200)
HDL: 31.2 mg/dL — ABNORMAL LOW (ref >39.00)
NonHDL: 84.85
Total CHOL/HDL Ratio: 4
Triglycerides: 216 mg/dL — ABNORMAL HIGH (ref 0.0–149.0)
VLDL: 43.2 mg/dL — ABNORMAL HIGH (ref 0.0–40.0)

## 2021-08-28 LAB — VITAMIN B12: Vitamin B-12: 426 pg/mL (ref 211–911)

## 2021-08-28 LAB — PSA: PSA: 1.76 ng/mL (ref 0.10–4.00)

## 2021-08-28 LAB — LDL CHOLESTEROL, DIRECT: Direct LDL: 52 mg/dL

## 2021-08-28 MED ORDER — TAMSULOSIN HCL 0.4 MG PO CAPS: 0.4000 mg | ORAL_CAPSULE | Freq: Every day | ORAL | 3 refills | Status: DC

## 2021-08-28 MED ORDER — SILDENAFIL CITRATE 50 MG PO TABS: 50.0000 mg | ORAL_TABLET | Freq: Every day | ORAL | 2 refills | Status: DC | PRN

## 2021-08-28 MED ORDER — GABAPENTIN 100 MG PO CAPS: 100.0000 mg | ORAL_CAPSULE | Freq: Every day | ORAL | 0 refills | Status: DC

## 2021-08-28 MED ORDER — OMEPRAZOLE 40 MG PO CPDR: 40.0000 mg | DELAYED_RELEASE_CAPSULE | Freq: Every day | ORAL | 3 refills | Status: AC | PRN

## 2021-08-28 MED ORDER — AMLODIPINE BESYLATE 5 MG PO TABS: 5.0000 mg | ORAL_TABLET | Freq: Every day | ORAL | 3 refills | Status: DC

## 2021-08-28 MED ORDER — LISINOPRIL 20 MG PO TABS: 20.0000 mg | ORAL_TABLET | Freq: Every day | ORAL | 3 refills | Status: DC

## 2021-08-28 NOTE — Addendum Note (Signed)
Addended by: Anabel Halon on: 08/28/2021 05:06 PM   Modules accepted: Orders

## 2021-08-28 NOTE — Patient Instructions (Addendum)
Diabetes- continue glipizide 5 mg daily and faxiga 10 mg daily. Follow up with Dr. Lonzo Cloud.  High cholesterol- continue atorvastatin 5 mg daily.   Htn- bp reasonably controlled on recheck. Continue zestril 20 mg daily and amlodipine 5 mg daily.  For neuropathy very likely diabetic will get b12 and b1 to check if other factors. Discussed on possible causes. Rx gabapentin. Advisement given benefit vs risk.  For bph rx flomax.  Gerd- refilled omeprazole.  Follow up one month or sooner if needed.

## 2021-08-28 NOTE — Progress Notes (Signed)
Subjective:    Patient ID: Joshua Skinner, male    DOB: 09-08-1970, 51 y.o.   MRN: [id number]  HPI  Diabetes- last a1c was 7.3 05-11-2022.  Pt has seen Dr. Kelton Pillar in past. On glipizide 5 mg daily and farxiga 10 mg daily.   Hyperlipidemia-  on atorvastatin 40 mg daily.  Htn-  on zestril 20 mg daily and amlodipine 5 mg daily.  Pt has lower extremity tingling. Pt has had symptoms for one year or more. Disrupts his sleep. Has symptoms on both sides. No low back pain. No radicular pain described. Pt reluctant to take any meds. 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  Bph with frequent urination. Flomax helps symptoms.  Jerrye Bushy- controlled with omeprazole.    Review of Systems  Constitutional:  Negative for chills, fatigue, fever and unexpected weight change.  Respiratory:  Negative for cough, chest tightness, shortness of breath and wheezing.   Cardiovascular:  Negative for chest pain and palpitations.  Gastrointestinal:  Negative for abdominal pain.  Genitourinary:  Negative for difficulty urinating, enuresis, flank pain, frequency and penile pain.  Musculoskeletal:  Negative for back pain, joint swelling and myalgias.  Skin:  Negative for rash.  Neurological:  Negative for dizziness, speech difficulty, weakness, numbness and headaches.       Lower ext neuropathy  Hematological:  Negative for adenopathy.  Psychiatric/Behavioral:  Negative for confusion and decreased concentration. The patient is not nervous/anxious and is not hyperactive.     Past Medical History:  Diagnosis Date   Anxiety    Arthritis    Asthma    GERD (gastroesophageal reflux disease)    Gout    last flare up 2020 per pt   Hemorrhoids    History of COVID-19 03/22/2019   fever, body aches chills x 3 weeks monoclonal iv antibodies given all symptoms resolved   History of COVID-19 07/2019   mild symtpoms all symptosm resolved   Hyperlipidemia    Hypertension    left  ureteral stone    Type 2 DM      Social History   Socioeconomic History   Marital status: Married    Spouse name: Not on file   Number of children: 3   Years of education: Not on file   Highest education level: Not on file  Occupational History   Occupation: Truck Geophysicist/field seismologist  Tobacco Use   Smoking status: Never   Smokeless tobacco: Never  Vaping Use   Vaping Use: Never used  Substance and Sexual Activity   Alcohol use: Never   Drug use: Never   Sexual activity: Yes  Other Topics Concern   Not on file  Social History Narrative   ** Merged History Encounter **       Social Determinants of Health   Financial Resource Strain: Not on file  Food Insecurity: Not on file  Transportation Needs: Not on file  Physical Activity: Not on file  Stress: Not on file  Social Connections: Not on file  Intimate Partner Violence: Not on file    Past Surgical History:  Procedure Laterality Date   COLONOSCOPY  08/05/2008   Colon polyp sttus post polypectomy. Small internal hemorrhoids (most likely etiology of bright red blood per rectum) Rare diverticula in the sigmoid colon.    COLONOSCOPY  08/10/2015   Dr. Ferdinand Lango. [facility], Internal hemorrhoids are noted, inadequate prep, limeted view , Repeat in 5 years   ESOPHAGOGASTRODUODENOSCOPY  06/25/2019   [place]. Irregular Z-line, biopsied, Gastritis, biopisied, hiatal hernia   HERNIA REPAIR Bilateral    inguinal yrs agp per pt on 01-11-2021    Family History  Problem Relation Age of Onset   Diabetes Mother    Irritable bowel syndrome Mother    Cirrhosis Mother    Prostate cancer Father    Diabetes Father    Kidney disease Father    Diabetes Sister    Cirrhosis Sister    Pancreatic cancer Neg Hx    Colon cancer Neg Hx    Stomach cancer Neg Hx    Esophageal cancer Neg Hx     Allergies  Allergen Reactions   Vicks Nyquil Cough [Doxylamine-Dm]     insomnia    Current Outpatient  Medications on File Prior to Visit  Medication Sig Dispense Refill   Albuterol Sulfate (PROAIR RESPICLICK) [id number] (90 Base) MCG/ACT AEPB Inhale 2 puffs into the lungs as needed.     allopurinol (ZYLOPRIM) 300 MG tablet Take 300 mg by mouth daily.     amLODipine (NORVASC) 5 MG tablet Take 1 tablet by mouth once daily 90 tablet 0   aspirin 81 MG chewable tablet Chew 81 mg by mouth daily.     atorvastatin (LIPITOR) 40 MG tablet Take 1 tablet by mouth once daily 90 tablet 0   Continuous Blood Gluc Sensor (FREESTYLE LIBRE 14 DAY SENSOR) MISC Change every 14 days 3 each 3   Continuous Blood Gluc Sensor (FREESTYLE LIBRE 2 SENSOR) MISC 1 Device by Does not apply route every 14 (fourteen) days. 2 each 11   dapagliflozin propanediol (FARXIGA) 10 MG TABS tablet Take 1 tablet (10 mg total) by mouth daily before breakfast. 90 tablet 3   glipiZIDE (GLUCOTROL) 5 MG tablet Take 1 tablet (5 mg total) by mouth daily before breakfast. 90 tablet 3   HYDROcodone-acetaminophen (NORCO) 5-325 MG tablet Take 1 tablet by mouth every 6 (six) hours as needed for moderate pain. (Patient not taking: Reported on 06/11/2021) 16 tablet 0   lisinopril (ZESTRIL) 20 MG tablet Take 1 tablet (20 mg total) by mouth daily. 90 tablet 0   omeprazole (PRILOSEC) 40 MG capsule Take 1 capsule by mouth daily as needed.     ondansetron (ZOFRAN ODT) 4 MG disintegrating tablet Take 1 tablet (4 mg total) by mouth every 8 (eight) hours as needed for nausea or vomiting. (Patient not taking: Reported on 06/11/2021) 20 tablet 0   sildenafil (VIAGRA) 50 MG tablet TAKE 1 TABLET BY MOUTH ONCE DAILY AS NEEDED FOR ERECTILE DYSFUNCTION 10 tablet 0   SUMAtriptan (IMITREX) 20 MG/ACT nasal spray MAY REPEAT IN 2 HOURS IF HEADACHE PERSISTS OR RECURS 6 each 0   tamsulosin (FLOMAX) 0.4 MG CAPS capsule TAKE 1 CAPSULE BY MOUTH AT BEDTIME *OVERDUE  FOR  VISIT* 15 capsule 0   No current facility-administered medications on file prior to visit.    There were no vitals  taken for this visit.      Objective:   Physical Exam  General Mental Status- Alert. General Appearance- Not in acute distress.   Skin General: Color- Normal Color. Moisture- Normal Moisture.  Neck Carotid Arteries- Normal color. Moisture- Normal Moisture. No carotid bruits. No JVD.  Chest and Lung Exam Auscultation: Breath Sounds:-Normal.  Cardiovascular Auscultation:Rythm- Regular. Murmurs & Other Heart Sounds:Auscultation of the heart reveals- No Murmurs.  Abdomen Inspection:-Inspeection Normal. Palpation/Percussion:Note:No mass. Palpation and Percussion of the abdomen reveal- Non Tender, Non Distended + BS, no  rebound or guarding.  Neurologic Cranial Nerve exam:- CN III-XII intact(No nystagmus), symmetric smile. ric strength both upper and lower extremities.    Lower ext- no pedal edema. Anterior tibial pulse and dorsal pedis pulse normal. Monofilament shows good sensation but less sensation rt foot metatarsal head area.     Assessment & Plan:   Patient Instructions  Diabetes- continue glipizide 5 mg daily and faxiga 10 mg daily. Follow up with Dr. Kelton Pillar.  High cholesterol- continue atorvastatin 5 mg daily.   Htn- bp reasonably controlled on recheck. Continue zestril 20 mg daily and amlodipine 5 mg daily.  For neuropathy very likely diabetic will get b12 and b1 to check if other factors. Discussed on possible causes. Rx gabapentin. Advisement given benefit vs risk.  For bph rx flomax.  Gerd- refilled omeprazole.  Follow up one month or sooner if needed.    Mackie Pai, PA-C

## 2021-09-01 LAB — VITAMIN B1: Vitamin B1 (Thiamine): 10 nmol/L (ref 8–30)

## 2021-10-01 ENCOUNTER — Ambulatory Visit: Payer: 59 | Admitting: Medical

## 2021-10-15 ENCOUNTER — Encounter: Payer: Self-pay | Admitting: Internal Medicine

## 2021-10-15 ENCOUNTER — Telehealth (INDEPENDENT_AMBULATORY_CARE_PROVIDER_SITE_OTHER): Payer: 59 | Admitting: Internal Medicine

## 2021-10-15 VITALS — Ht 67.0 in | Wt 212.0 lb

## 2021-10-15 DIAGNOSIS — E1149 Type 2 diabetes mellitus with other diabetic neurological complication: Secondary | ICD-10-CM

## 2021-10-15 DIAGNOSIS — E1165 Type 2 diabetes mellitus with hyperglycemia: Secondary | ICD-10-CM | POA: Diagnosis not present

## 2021-10-15 DIAGNOSIS — G63 Polyneuropathy in diseases classified elsewhere: Secondary | ICD-10-CM

## 2021-10-15 MED ORDER — GLIPIZIDE 5 MG PO TABS
5.0000 mg | ORAL_TABLET | Freq: Two times a day (BID) | ORAL | 3 refills | Status: DC
Start: 2021-10-15 — End: 2022-10-23

## 2021-10-15 NOTE — Progress Notes (Signed)
Virtual Visit via Video Note  I connected with Joshua Skinner on 10/15/21 at 1:40 pm  by a video enabled telemedicine application and verified that I am speaking with the correct person using two identifiers.   I discussed the limitations of evaluation and management by telemedicine and the availability of in person appointments. The patient expressed understanding and agreed to proceed.   -Location of the patient : Home -Location of the provider : Office -The names of all persons participating in the telemedicine service : Joshua Skinner, wife and myself        Name: Joshua Skinner  MRN/ DOB: [id number], March 24, 1971   Age/ Sex: 51 y.o., male    PCP: Marisue Brooklyn   Reason for Endocrinology Evaluation: Type 2 Diabetes Mellitus     Date of Initial Endocrinology Visit: 08/14/2020    PATIENT IDENTIFIER: Joshua Skinner is a 51 y.o. male with a past medical history of HTN, T2 DM, and dyslipidemia. The patient presented for initial endocrinology clinic visit on 08/14/2020 for consultative assistance with his diabetes management.    HPI: Joshua Skinner is accompanied by his spouse and daughter     DIABETIC HISTORY:  [name] was diagnosed with DM in 2012, he has been on Glimepiride, OZempic/Trulicity - bad diarrhea . His hemoglobin A1c has ranged from 7.4% in 2022, peaking at 8.1% in 2021.   On his initial visit to our clinic he had an A1c of 7.4 % , he was on Metformin and we started [place]   Stopped Metformin due to GI symptoms and started Glipizide 12/2020  He is a truck driver  SUBJECTIVE:   During the last visit (06/11/2021): A1c 7.3% continued [place] and  glipizide      Today (01/29/2021): [name] is here for a follow up on diabetes management.  He checks glucose multiple times daily through CGM.  He has not had any hypoglycemic episodes since his last visit to our clinic.  Has noted tingling ad cold sensation of feet , its uncomfortable . Capsaicin cream did not help.  He  initially did not want gabapentin but his PCP prescribed it for him twice daily dosing, the pain has improved but he continues to have uncomfortable cold feet   HOME DIABETES REGIMEN: Glipizide 5 mg daily  Farxiga 10 mg daily    Statin: yes ACE-I/ARB: yes  Prior Diabetic Education: no    CONTINUOUS GLUCOSE MONITORING RECORD INTERPRETATION    Dates of Recording: 7/4-7/17/2023  Sensor description:freestyle libre   Results statistics:   CGM use % of time 77  Average and SD 189/23  Time in range  44     %  % Time Above 180 49  % Time above 250 7  % Time Below target 0      Glycemic patterns summary:Joshua Skinner noted with hyperglycemia overnight, BG's trend to goal between noon and 6 pm  Hyperglycemic episodes  postprandial    Hypoglycemic episodes occurred n/a  Overnight periods: stable but high       DIABETIC COMPLICATIONS: Microvascular complications:   Denies: CKD , retinopathy, neuropathy  Last eye exam: Completed 2022  Macrovascular complications:   Denies: CAD, PVD, CVA   PAST HISTORY: Past Medical History:  Past Medical History:  Diagnosis Date   Anxiety    Arthritis    Asthma    GERD (gastroesophageal reflux disease)    Gout    last flare up 2020 per Joshua Skinner   Hemorrhoids    History of COVID-19 03/22/2019  fever, body aches chills x 3 weeks monoclonal iv antibodies given all symptoms resolved   History of COVID-19 07/2019   mild symtpoms all symptosm resolved   Hyperlipidemia    Hypertension    left ureteral stone    Type 2 DM    Past Surgical History:  Past Surgical History:  Procedure Laterality Date   COLONOSCOPY  08/05/2008   Colon polyp sttus post polypectomy. Small internal hemorrhoids (most likely etiology of bright red blood per rectum) Rare diverticula in the sigmoid colon.    COLONOSCOPY  08/10/2015   Dr. Noe Gens. [facility], Internal hemorrhoids are noted, inadequate prep, limeted view , Repeat in 5 years    ESOPHAGOGASTRODUODENOSCOPY  06/25/2019   Dr.Shabena Shahid, [facility]. Irregular Z-line, biopsied, Gastritis, biopisied, hiatal hernia   HERNIA REPAIR Bilateral    inguinal yrs agp per Joshua Skinner on 01-11-2021    Social History:  reports that he has never smoked. He has never used smokeless tobacco. He reports that he does not drink alcohol and does not use drugs. Family History:  Family History  Problem Relation Age of Onset   Diabetes Mother    Irritable bowel syndrome Mother    Cirrhosis Mother    Prostate cancer Father    Diabetes Father    Kidney disease Father    Diabetes Sister    Cirrhosis Sister    Pancreatic cancer Neg Hx    Colon cancer Neg Hx    Stomach cancer Neg Hx    Esophageal cancer Neg Hx      HOME MEDICATIONS: Allergies as of 10/15/2021       Reactions   Vicks Nyquil Cough [doxylamine-dm]    insomnia        Medication List        Accurate as of October 15, 2021  1:38 PM. If you have any questions, ask your nurse or doctor.          Albuterol Sulfate 108 (90 Base) MCG/ACT Aepb Commonly known as: PROAIR RESPICLICK Inhale 2 puffs into the lungs as needed.   allopurinol 300 MG tablet Commonly known as: ZYLOPRIM Take 300 mg by mouth daily.   amLODipine 5 MG tablet Commonly known as: NORVASC Take 1 tablet (5 mg total) by mouth daily.   aspirin 81 MG chewable tablet Chew 81 mg by mouth daily.   atorvastatin 40 MG tablet Commonly known as: LIPITOR Take 1 tablet by mouth once daily   dapagliflozin propanediol 10 MG Tabs tablet Commonly known as: Farxiga Take 1 tablet (10 mg total) by mouth daily before breakfast.   FreeStyle Libre 2 Sensor Misc 1 Device by Does not apply route every 14 (fourteen) days.   FreeStyle Libre 14 Day Sensor Misc Change every 14 days   gabapentin 100 MG capsule Commonly known as: NEURONTIN Take 1 capsule (100 mg total) by mouth at bedtime.   glipiZIDE 5 MG tablet Commonly known as: GLUCOTROL Take 1  tablet (5 mg total) by mouth daily before breakfast.   HYDROcodone-acetaminophen 5-325 MG tablet Commonly known as: Norco Take 1 tablet by mouth every 6 (six) hours as needed for moderate pain.   lisinopril 20 MG tablet Commonly known as: ZESTRIL Take 1 tablet (20 mg total) by mouth daily.   omeprazole 40 MG capsule Commonly known as: PRILOSEC Take 1 capsule (40 mg total) by mouth daily as needed.   ondansetron 4 MG disintegrating tablet Commonly known as: Zofran ODT Take 1 tablet (4 mg total) by mouth  every 8 (eight) hours as needed for nausea or vomiting.   sildenafil 50 MG tablet Commonly known as: VIAGRA Take 1 tablet (50 mg total) by mouth daily as needed for erectile dysfunction.   SUMAtriptan 20 MG/ACT nasal spray Commonly known as: IMITREX MAY REPEAT IN 2 HOURS IF HEADACHE PERSISTS OR RECURS   tamsulosin 0.4 MG Caps capsule Commonly known as: FLOMAX TAKE 1 CAPSULE BY MOUTH AT BEDTIME *OVERDUE  FOR  VISIT*   tamsulosin 0.4 MG Caps capsule Commonly known as: FLOMAX Take 1 capsule (0.4 mg total) by mouth daily.         ALLERGIES: Allergies  Allergen Reactions   Vicks Nyquil Cough [Doxylamine-Dm]     insomnia     REVIEW OF SYSTEMS: A comprehensive ROS was conducted with the patient and is negative except as per HPI   OBJECTIVE:    PHYSICAL EXAM:  General: Joshua Skinner appears well and is in NAD  Neuro: MS is good with appropriate affect, Joshua Skinner is alert and Ox3    DM foot exam: 06/11/2021  The skin of the feet is intact without sores or ulcerations. The pedal pulses are 1+ on right and 1+ on left. The sensation is decreased  to a screening 5.07, 10 gram monofilament B/L      DATA REVIEWED:  Lab Results  Component Value Date   HGBA1C 9.2 (H) 01/08/2021   HGBA1C 7.8 (A) 08/14/2020   HGBA1C 7.4 (H) 05/22/2020   Lab Results  Component Value Date   MICROALBUR 5.4 (H) 06/11/2021   LDLCALC 42 01/28/2020   CREATININE 1.33 08/28/2021    Lab Results   Component Value Date   CHOL 116 08/28/2021   HDL 31.20 (L) 08/28/2021   LDLCALC 42 01/28/2020   LDLDIRECT 52.0 08/28/2021   TRIG 216.0 (H) 08/28/2021   CHOLHDL 4 08/28/2021        Latest Reference Range & Units 06/11/21 09:23  Sodium 135 - 145 mEq/L 140  Potassium 3.5 - 5.1 mEq/L 4.3  Chloride 96 - 112 mEq/L 105  CO2 19 - 32 mEq/L 25  Glucose 70 - 99 mg/dL 294 (H)  BUN 6 - 23 mg/dL 23  Creatinine 7.65 - 4.65 mg/dL 0.35  Calcium 8.4 - 46.5 mg/dL 9.8  GFR >68.12 mL/min 61.57  Total CHOL/HDL Ratio  4  Cholesterol 0 - 200 mg/dL 751  HDL Cholesterol >70.01 mg/dL 74.94 (L)  Direct LDL mg/dL 49.6  MICROALB/CREAT RATIO 0.0 - 30.0 mg/g 6.8  NonHDL  92.15  Triglycerides 0.0 - 149.0 mg/dL 759.1 (H)  VLDL 0.0 - 63.8 mg/dL 46.6 (H)    Latest Reference Range & Units 06/11/21 09:23  Creatinine,U mg/dL 59.9  Microalb, Ur 0.0 - 1.9 mg/dL 5.4 (H)  MICROALB/CREAT RATIO 0.0 - 30.0 mg/g 6.8      ASSESSMENT / PLAN / RECOMMENDATIONS:   1) Type 2 Diabetes Mellitus,Sub- Optimally  controlled, With neuropathic complications - Most recent A1c of 7.3 %. Goal A1c < 7.0 %.    Patient has been noted with hypoglycemia on CGM download Trulicity/Ozempic caused severe diarrhea Januvia cost prohibitive Intolerant metformin due to GI side effects.   I am going to increase glipizide to twice daily dosing, patient advised to take it before breakfast and supper  MEDICATIONS:  Increase glipizide 5 MG , 1 tablet twice daily Continue Farxiga 10 mg 1 tablet daily  EDUCATION / INSTRUCTIONS: BG monitoring instructions: Patient is instructed to check his blood sugars 1 times a day, fasting. Call [facility] End[facility] clinic  if: BG persistently < 70 I reviewed the Rule of 15 for the treatment of hypoglycemia in detail with the patient. Literature supplied.   2) Diabetic complications:  Eye: Does not have known diabetic retinopathy.  Neuro/ Feet: Does  have known diabetic peripheral neuropathy. Renal:  Patient does not have known baseline CKD. He is on an ACEI/ARB at present   3) Dyslipidemia :   -LDL at goal but TG continues to be elevated -Patient on atorvastatin 40 mg daily -He was advised to add fish oil 1200 mg twice daily in the past    4) Peripheral neuropathy:   -His PCP started him on gabapentin which has helped with some of his neuropathic symptoms but he continues with cold toes.  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 -He currently takes it in the morning and at night, and I have advised him to try taking the 2 tablets at bedtime and skip morning dosing  Medication  Change gabapentin 100 mg, 2 tablets at bedtime     Follow-up in 4 months    Signed electronically by: Lyndle Herrlich, MD  [facility]  [facility] [address] [place], [place] [address] Phone: [phone] FAX: [phone]   CC: Marisue Brooklyn [address] [place] [address] Phone: [phone]  Fax: [phone]    Return to Endocrinology clinic as below: Future Appointments  Date Time Provider Department Center  10/15/2021  1:40 PM Kerin Cecchi, Konrad Dolores, MD [id number] None

## 2021-10-16 ENCOUNTER — Encounter: Payer: Self-pay | Admitting: Internal Medicine

## 2021-10-29 ENCOUNTER — Other Ambulatory Visit: Payer: Self-pay | Admitting: Medical

## 2021-11-10 ENCOUNTER — Other Ambulatory Visit: Payer: Self-pay | Admitting: Medical

## 2021-11-11 ENCOUNTER — Encounter: Payer: Self-pay | Admitting: Medical

## 2021-11-12 MED ORDER — GABAPENTIN 100 MG PO CAPS: ORAL_CAPSULE | ORAL | 3 refills | Status: DC

## 2021-11-12 NOTE — Addendum Note (Signed)
Addended by: Gwenevere Abbot on: 11/12/2021 07:49 PM   Modules accepted: Orders

## 2021-11-17 ENCOUNTER — Other Ambulatory Visit: Payer: Self-pay | Admitting: Medical

## 2021-11-23 DIAGNOSIS — R350 Frequency of micturition: Secondary | ICD-10-CM | POA: Diagnosis not present

## 2021-11-23 DIAGNOSIS — N401 Enlarged prostate with lower urinary tract symptoms: Secondary | ICD-10-CM | POA: Diagnosis not present

## 2021-11-23 DIAGNOSIS — N138 Other obstructive and reflux uropathy: Secondary | ICD-10-CM | POA: Diagnosis not present

## 2021-11-23 DIAGNOSIS — R972 Elevated prostate specific antigen [PSA]: Secondary | ICD-10-CM | POA: Diagnosis not present

## 2021-11-23 DIAGNOSIS — N503 Cyst of epididymis: Secondary | ICD-10-CM | POA: Diagnosis not present

## 2021-12-14 ENCOUNTER — Encounter: Payer: Self-pay | Admitting: Medical

## 2021-12-21 DIAGNOSIS — N503 Cyst of epididymis: Secondary | ICD-10-CM | POA: Diagnosis not present

## 2021-12-21 DIAGNOSIS — N433 Hydrocele, unspecified: Secondary | ICD-10-CM | POA: Diagnosis not present

## 2022-01-13 ENCOUNTER — Other Ambulatory Visit: Payer: Self-pay | Admitting: Internal Medicine

## 2022-02-11 ENCOUNTER — Ambulatory Visit: Payer: 59 | Admitting: Medical

## 2022-02-11 ENCOUNTER — Encounter: Payer: Self-pay | Admitting: Medical

## 2022-02-11 VITALS — BP 138/80 | HR 79 | Temp 98.0°F | Resp 18 | Ht 67.0 in | Wt 211.0 lb

## 2022-02-11 DIAGNOSIS — Z0000 Encounter for general adult medical examination without abnormal findings: Secondary | ICD-10-CM

## 2022-02-11 DIAGNOSIS — Z125 Encounter for screening for malignant neoplasm of prostate: Secondary | ICD-10-CM | POA: Diagnosis not present

## 2022-02-11 DIAGNOSIS — E785 Hyperlipidemia, unspecified: Secondary | ICD-10-CM | POA: Diagnosis not present

## 2022-02-11 DIAGNOSIS — I10 Essential (primary) hypertension: Secondary | ICD-10-CM

## 2022-02-11 DIAGNOSIS — E1149 Type 2 diabetes mellitus with other diabetic neurological complication: Secondary | ICD-10-CM | POA: Diagnosis not present

## 2022-02-11 LAB — LIPID PANEL
Cholesterol: 181 mg/dL (ref 0–200)
HDL: 32.5 mg/dL — ABNORMAL LOW (ref >39.00)
NonHDL: 148.94
Total CHOL/HDL Ratio: 6
Triglycerides: 392 mg/dL — ABNORMAL HIGH (ref 0.0–149.0)
VLDL: 78.4 mg/dL — ABNORMAL HIGH (ref 0.0–40.0)

## 2022-02-11 LAB — CBC WITH DIFFERENTIAL/PLATELET
Basophils Absolute: 0 10*3/uL (ref 0.0–0.1)
Basophils Relative: 0.4 % (ref 0.0–3.0)
Eosinophils Absolute: 0.2 10*3/uL (ref 0.0–0.7)
Eosinophils Relative: 3.9 % (ref 0.0–5.0)
HCT: 44.6 % (ref 39.0–52.0)
Hemoglobin: 14.9 g/dL (ref 13.0–17.0)
Lymphocytes Relative: 27.5 % (ref 12.0–46.0)
Lymphs Abs: 1.5 10*3/uL (ref 0.7–4.0)
MCHC: 33.5 g/dL (ref 30.0–36.0)
MCV: 85.4 fl (ref 78.0–100.0)
Monocytes Absolute: 0.5 10*3/uL (ref 0.1–1.0)
Monocytes Relative: 8.9 % (ref 3.0–12.0)
Neutro Abs: 3.3 10*3/uL (ref 1.4–7.7)
Neutrophils Relative %: 59.3 % (ref 43.0–77.0)
Platelets: 165 10*3/uL (ref 150.0–400.0)
RBC: 5.22 Mil/uL (ref 4.22–5.81)
RDW: 13.5 % (ref 11.5–15.5)
WBC: 5.6 10*3/uL (ref 4.0–10.5)

## 2022-02-11 LAB — COMPREHENSIVE METABOLIC PANEL
ALT: 19 U/L (ref 0–53)
AST: 16 U/L (ref 0–37)
Albumin: 4.7 g/dL (ref 3.5–5.2)
Alkaline Phosphatase: 39 U/L (ref 39–117)
BUN: 27 mg/dL — ABNORMAL HIGH (ref 6–23)
CO2: 25 mEq/L (ref 19–32)
Calcium: 9.6 mg/dL (ref 8.4–10.5)
Chloride: 104 mEq/L (ref 96–112)
Creatinine, Ser: 1.36 mg/dL (ref 0.40–1.50)
GFR: 60.21 mL/min (ref >60.00)
Glucose, Bld: 130 mg/dL — ABNORMAL HIGH (ref 70–99)
Potassium: 4.3 mEq/L (ref 3.5–5.1)
Sodium: 139 mEq/L (ref 135–145)
Total Bilirubin: 0.5 mg/dL (ref 0.2–1.2)
Total Protein: 7.1 g/dL (ref 6.0–8.3)

## 2022-02-11 LAB — PSA: PSA: 0.35 ng/mL (ref 0.10–4.00)

## 2022-02-11 LAB — LDL CHOLESTEROL, DIRECT: Direct LDL: 79 mg/dL

## 2022-02-11 MED ORDER — TRAZODONE HCL 50 MG PO TABS: 25.0000 mg | ORAL_TABLET | Freq: Every evening | ORAL | 3 refills | Status: AC | PRN

## 2022-02-11 MED ORDER — FENOFIBRATE 48 MG PO TABS: 48.0000 mg | ORAL_TABLET | Freq: Every day | ORAL | 3 refills | Status: DC

## 2022-02-11 MED ORDER — EZETIMIBE 10 MG PO TABS: 10.0000 mg | ORAL_TABLET | Freq: Every day | ORAL | 3 refills | Status: DC

## 2022-02-11 NOTE — Patient Instructions (Addendum)
For you wellness exam today I have ordered cbc, cmp and lipid panel. Screening psa.  Vaccine given today.   Recommend exercise and healthy diet.  We will let you know lab results as they come in.  Follow up date appointment will be determined after lab review.    Weakness to legs with statin. Resolved after stopping. Rx zetia in place of due to risk factors and high cholesterol  Some insomnia but only on weekends. Rx trazadone on weekends when not driving.   Htn- bp borderline. Continue zestril 20 mg daily and amlodipine 5 mg daily.  Preventive Care 30-73 Years Old, Male Preventive care refers to lifestyle choices and visits with your health care provider that can promote health and wellness. Preventive care visits are also called wellness exams. What can I expect for my preventive care visit? Counseling During your preventive care visit, your health care provider may ask about your: Medical history, including: Past medical problems. Family medical history. Current health, including: Emotional well-being. Home life and relationship well-being. Sexual activity. Lifestyle, including: Alcohol, nicotine or tobacco, and drug use. Access to firearms. Diet, exercise, and sleep habits. Safety issues such as seatbelt and bike helmet use. Sunscreen use. Work and work Astronomer. Physical exam Your health care provider will check your: Height and weight. These may be used to calculate your BMI (body mass index). BMI is a measurement that tells if you are at a healthy weight. Waist circumference. This measures the distance around your waistline. This measurement also tells if you are at a healthy weight and may help predict your risk of certain diseases, such as type 2 diabetes and high blood pressure. Heart rate and blood pressure. Body temperature. Skin for abnormal spots. What immunizations do I need?  Vaccines are usually given at various ages, according to a schedule. Your  health care provider will recommend vaccines for you based on your age, medical history, and lifestyle or other factors, such as travel or where you work. What tests do I need? Screening Your health care provider may recommend screening tests for certain conditions. This may include: Lipid and cholesterol levels. Diabetes screening. This is done by checking your blood sugar (glucose) after you have not eaten for a while (fasting). Hepatitis B test. Hepatitis C test. HIV (human immunodeficiency virus) test. STI (sexually transmitted infection) testing, if you are at risk. Lung cancer screening. Prostate cancer screening. Colorectal cancer screening. Talk with your health care provider about your test results, treatment options, and if necessary, the need for more tests. Follow these instructions at home: Eating and drinking  Eat a diet that includes fresh fruits and vegetables, whole grains, lean protein, and low-fat dairy products. Take vitamin and mineral supplements as recommended by your health care provider. Do not drink alcohol if your health care provider tells you not to drink. If you drink alcohol: Limit how much you have to 0-2 drinks a day. Know how much alcohol is in your drink. In the [place] one drink equals one 12 oz bottle of beer (355 mL), one 5 oz glass of wine (148 mL), or one 1 oz glass of hard liquor (44 mL). Lifestyle Brush your teeth every morning and night with fluoride toothpaste. Floss one time each day. Exercise for at least 30 minutes 5 or more days each week. Do not use any products that contain nicotine or tobacco. These products include cigarettes, chewing tobacco, and vaping devices, such as e-cigarettes. If you need help quitting, ask your health care  provider. Do not use drugs. If you are sexually active, practice safe sex. Use a condom or other form of protection to prevent STIs. Take aspirin only as told by your health care provider. Make sure that you  understand how much to take and what form to take. Work with your health care provider to find out whether it is safe and beneficial for you to take aspirin daily. Find healthy ways to manage stress, such as: Meditation, yoga, or listening to music. Journaling. Talking to a trusted person. Spending time with friends and family. Minimize exposure to UV radiation to reduce your risk of skin cancer. Safety Always wear your seat belt while driving or riding in a vehicle. Do not drive: If you have been drinking alcohol. Do not ride with someone who has been drinking. When you are tired or distracted. While texting. If you have been using any mind-altering substances or drugs. Wear a helmet and other protective equipment during sports activities. If you have firearms in your house, make sure you follow all gun safety procedures. What's next? Go to your health care provider once a year for an annual wellness visit. Ask your health care provider how often you should have your eyes and teeth checked. Stay up to date on all vaccines. This information is not intended to replace advice given to you by your health care provider. Make sure you discuss any questions you have with your health care provider. Document Revised: 09/13/2020 Document Reviewed: 09/13/2020 Elsevier Patient Education  [address].

## 2022-02-11 NOTE — Addendum Note (Signed)
Addended by: Gwenevere Abbot on: 02/11/2022 09:40 AM   Modules accepted: Orders

## 2022-02-11 NOTE — Addendum Note (Signed)
Addended by: Gwenevere Abbot on: 02/11/2022 09:20 PM   Modules accepted: Orders

## 2022-02-11 NOTE — Progress Notes (Signed)
Subjective:    Patient ID: Joshua Skinner, male    DOB: 05-12-1970, 51 y.o.   MRN: [id number]  HPI  Pt agrees to wellness exam.   He is fasting.Pt works as Naval architect. Does not exercise. Admits not best diet. [employer] about once a day. Married.   Nonsmoker and no alcohol  Declines flu vaccine  Pt in for some thigh achiness and some calf pain. He states just felt weakness and faint pain. Like he ran long distance. He decided to quit atorvastatin and his symptoms resolved. No cramping in his legs.   Htn- bp borderline. Still on same meds.  He updates me that sugar levels are better. He is seeing Dr. Lonzo Cloud sugar average better per pt. Sugar level right now 136. His cgm states average sugar 152.  Pt updates me that his neuropathy is less with gabapentin.     On prior visit A/P  "Diabetes- continue glipizide 5 mg daily and faxiga 10 mg daily. Follow up with Dr. Lonzo Cloud.   High cholesterol- continue atorvastatin 5 mg daily.    Htn- bp reasonably controlled on recheck. Continue zestril 20 mg daily and amlodipine 5 mg daily.   For neuropathy very likely diabetic will get b12 and b1 to check if other factors. Discussed on possible causes. Rx gabapentin. Advisement given benefit vs risk.   For bph rx flomax.   Gerd- refilled omeprazole."    Review of Systems  Constitutional:  Negative for chills, fatigue and fever.  HENT:  Negative for congestion, ear pain and facial swelling.   Respiratory:  Negative for cough, chest tightness, shortness of breath and wheezing.   Cardiovascular:  Negative for chest pain and palpitations.  Gastrointestinal:  Negative for abdominal pain.  Musculoskeletal:  Negative for back pain.  Neurological:  Negative for dizziness, seizures, speech difficulty, weakness, numbness and headaches.  Hematological:  Negative for adenopathy. Does not bruise/bleed easily.  Psychiatric/Behavioral:  Positive for sleep disturbance. Negative for behavioral  problems. The patient is not nervous/anxious.        Only on weekends when not on the road.    Past Medical History:  Diagnosis Date   Anxiety    Arthritis    Asthma    GERD (gastroesophageal reflux disease)    Gout    last flare up 2020 per pt   Hemorrhoids    History of COVID-19 03/22/2019   fever, body aches chills x 3 weeks monoclonal iv antibodies given all symptoms resolved   History of COVID-19 07/2019   mild symtpoms all symptosm resolved   Hyperlipidemia    Hypertension    left ureteral stone    Type 2 DM      Social History   Socioeconomic History   Marital status: Married    Spouse name: Not on file   Number of children: 3   Years of education: Not on file   Highest education level: Not on file  Occupational History   Occupation: Truck Hospital doctor  Tobacco Use   Smoking status: Never   Smokeless tobacco: Never  Vaping Use   Vaping Use: Never used  Substance and Sexual Activity   Alcohol use: Never   Drug use: Never   Sexual activity: Yes  Other Topics Concern   Not on file  Social History Narrative   ** Merged History Encounter **       Social Determinants of Health   Financial Resource Strain: Not on file  Food Insecurity: Not on file  Transportation Needs: Not on file  Physical Activity: Not on file  Stress: Not on file  Social Connections: Not on file  Intimate Partner Violence: Not on file    Past Surgical History:  Procedure Laterality Date   COLONOSCOPY  08/05/2008   Colon polyp sttus post polypectomy. Small internal hemorrhoids (most likely etiology of bright red blood per rectum) Rare diverticula in the sigmoid colon.    COLONOSCOPY  08/10/2015   Dr. Noe Gens. [facility], Internal hemorrhoids are noted, inadequate prep, limeted view , Repeat in 5 years   ESOPHAGOGASTRODUODENOSCOPY  06/25/2019   Dr.Shabena Shahid, [facility]. Irregular Z-line, biopsied, Gastritis, biopisied, hiatal hernia   HERNIA REPAIR Bilateral     inguinal yrs agp per pt on 01-11-2021    Family History  Problem Relation Age of Onset   Diabetes Mother    Irritable bowel syndrome Mother    Cirrhosis Mother    Prostate cancer Father    Diabetes Father    Kidney disease Father    Diabetes Sister    Cirrhosis Sister    Pancreatic cancer Neg Hx    Colon cancer Neg Hx    Stomach cancer Neg Hx    Esophageal cancer Neg Hx     Allergies  Allergen Reactions   Vicks Nyquil Cough [Doxylamine-Dm]     insomnia    Current Outpatient Medications on File Prior to Visit  Medication Sig Dispense Refill   Albuterol Sulfate (PROAIR RESPICLICK) 108 (90 Base) MCG/ACT AEPB Inhale 2 puffs into the lungs as needed.     allopurinol (ZYLOPRIM) 300 MG tablet Take 300 mg by mouth daily.     amLODipine (NORVASC) 5 MG tablet Take 1 tablet (5 mg total) by mouth daily. 90 tablet 3   aspirin 81 MG chewable tablet Chew 81 mg by mouth daily.     atorvastatin (LIPITOR) 40 MG tablet Take 1 tablet (40 mg total) by mouth daily. 90 tablet 0   Continuous Blood Gluc Sensor (FREESTYLE LIBRE 14 DAY SENSOR) MISC Change every 14 days 3 each 3   Continuous Blood Gluc Sensor (FREESTYLE LIBRE 2 SENSOR) MISC PLACE 1 DEVICE BY DOES NOT APPLY ROUTE EVERY 14 DAYS 2 each 3   dapagliflozin propanediol (FARXIGA) 10 MG TABS tablet Take 1 tablet (10 mg total) by mouth daily before breakfast. 90 tablet 3   gabapentin (NEURONTIN) 100 MG capsule 1 tab po in am and  2 tab po at night. 90 capsule 3   glipiZIDE (GLUCOTROL) 5 MG tablet Take 1 tablet (5 mg total) by mouth 2 (two) times daily before a meal. 180 tablet 3   HYDROcodone-acetaminophen (NORCO) 5-325 MG tablet Take 1 tablet by mouth every 6 (six) hours as needed for moderate pain. 16 tablet 0   lisinopril (ZESTRIL) 20 MG tablet Take 1 tablet (20 mg total) by mouth daily. 90 tablet 3   omeprazole (PRILOSEC) 40 MG capsule Take 1 capsule (40 mg total) by mouth daily as needed. 90 capsule 3   ondansetron (ZOFRAN ODT) 4 MG  disintegrating tablet Take 1 tablet (4 mg total) by mouth every 8 (eight) hours as needed for nausea or vomiting. 20 tablet 0   sildenafil (VIAGRA) 50 MG tablet Take 1 tablet (50 mg total) by mouth daily as needed for erectile dysfunction. 10 tablet 2   SUMAtriptan (IMITREX) 20 MG/ACT nasal spray MAY REPEAT IN 2 HOURS IF HEADACHE PERSISTS OR RECURS 6 each 0   tamsulosin (FLOMAX) 0.4 MG CAPS capsule TAKE 1  CAPSULE BY MOUTH AT BEDTIME *OVERDUE  FOR  VISIT* 15 capsule 0   tamsulosin (FLOMAX) 0.4 MG CAPS capsule Take 1 capsule (0.4 mg total) by mouth daily. 30 capsule 3   No current facility-administered medications on file prior to visit.    BP 138/80   Pulse 79   Temp 98 F (36.7 C)   Resp 18   Ht 5\' 7"  (1.702 m)   Wt 211 lb (95.7 kg)   SpO2 99%   BMI 33.05 kg/m        Objective:   Physical Exam   General Mental Status- Alert. General Appearance- Not in acute distress.   Skin General: Color- Normal Color. Moisture- Normal Moisture.  Neck Carotid Arteries- Normal color. Moisture- Normal Moisture. No carotid bruits. No JVD.  Chest and Lung Exam Auscultation: Breath Sounds:-Normal.  Cardiovascular Auscultation:Rythm- Regular. Murmurs & Other Heart Sounds:Auscultation of the heart reveals- No Murmurs.  Abdomen Inspection:-Inspeection Normal. Palpation/Percussion:Note:No mass. Palpation and Percussion of the abdomen reveal- Non Tender, Non Distended + BS, no rebound or guarding.   Neurologic Cranial Nerve exam:- CN III-XII intact(No nystagmus), symmetric smile. Drift Test:- No drift. Romberg Exam:- Negative.  Heal to Toe Gait exam:-Normal. Finger to Nose:- Normal/Intact Strength:- 5/5 equal and symmetric strength both upper and lower extremities.      Assessment & Plan:   Patient Instructions  For you wellness exam today I have ordered cbc, cmp and lipid panel. Screening psa.  Vaccine given today.   Recommend exercise and healthy diet.  We will let you know  lab results as they come in.  Follow up date appointment will be determined after lab review.    Weakness to legs with statin. Resolved after stopping. Rx zetia in place of due to risk factors and high cholesterol  Some insomnia but only on weekends. Rx trazadone on weekends when not driving.   Htn- bp borderline. Continue zestril 20 mg daily and amlodipine 5 mg daily.        , Esperanza Richters   [place] charge as did address high cholesterol side effects, htn and insomnia.

## 2022-03-10 ENCOUNTER — Other Ambulatory Visit: Payer: Self-pay | Admitting: Medical

## 2022-04-12 DIAGNOSIS — K120 Recurrent oral aphthae: Secondary | ICD-10-CM | POA: Diagnosis not present

## 2022-04-15 DIAGNOSIS — R140 Abdominal distension (gaseous): Secondary | ICD-10-CM | POA: Diagnosis not present

## 2022-04-15 DIAGNOSIS — Z20828 Contact with and (suspected) exposure to other viral communicable diseases: Secondary | ICD-10-CM | POA: Diagnosis not present

## 2022-04-15 DIAGNOSIS — R52 Pain, unspecified: Secondary | ICD-10-CM | POA: Diagnosis not present

## 2022-04-15 DIAGNOSIS — K649 Unspecified hemorrhoids: Secondary | ICD-10-CM | POA: Diagnosis not present

## 2022-04-15 DIAGNOSIS — Z6833 Body mass index (BMI) 33.0-33.9, adult: Secondary | ICD-10-CM | POA: Diagnosis not present

## 2022-04-15 DIAGNOSIS — K5900 Constipation, unspecified: Secondary | ICD-10-CM | POA: Diagnosis not present

## 2022-04-15 DIAGNOSIS — R110 Nausea: Secondary | ICD-10-CM | POA: Diagnosis not present

## 2022-04-16 ENCOUNTER — Other Ambulatory Visit (HOSPITAL_BASED_OUTPATIENT_CLINIC_OR_DEPARTMENT_OTHER): Payer: Self-pay

## 2022-04-16 ENCOUNTER — Ambulatory Visit (HOSPITAL_BASED_OUTPATIENT_CLINIC_OR_DEPARTMENT_OTHER)
Admission: RE | Admit: 2022-04-16 | Discharge: 2022-04-16 | Disposition: A | Discharge status: Home / Self Care | Payer: 59 | Source: Ambulatory Visit | Attending: Medical | Admitting: Medical

## 2022-04-16 ENCOUNTER — Ambulatory Visit: Payer: 59 | Admitting: Medical

## 2022-04-16 ENCOUNTER — Other Ambulatory Visit: Payer: Self-pay | Admitting: Medical

## 2022-04-16 VITALS — BP 117/74 | HR 89 | Temp 97.6°F | Resp 18 | Ht 67.0 in | Wt 205.8 lb

## 2022-04-16 DIAGNOSIS — K121 Other forms of stomatitis: Secondary | ICD-10-CM | POA: Diagnosis not present

## 2022-04-16 DIAGNOSIS — M2550 Pain in unspecified joint: Secondary | ICD-10-CM

## 2022-04-16 DIAGNOSIS — R1084 Generalized abdominal pain: Secondary | ICD-10-CM | POA: Insufficient documentation

## 2022-04-16 DIAGNOSIS — K140 Glossitis: Secondary | ICD-10-CM

## 2022-04-16 DIAGNOSIS — R5383 Other fatigue: Secondary | ICD-10-CM | POA: Diagnosis not present

## 2022-04-16 DIAGNOSIS — K649 Unspecified hemorrhoids: Secondary | ICD-10-CM | POA: Diagnosis not present

## 2022-04-16 DIAGNOSIS — E1169 Type 2 diabetes mellitus with other specified complication: Secondary | ICD-10-CM

## 2022-04-16 DIAGNOSIS — E785 Hyperlipidemia, unspecified: Secondary | ICD-10-CM

## 2022-04-16 DIAGNOSIS — K5900 Constipation, unspecified: Secondary | ICD-10-CM | POA: Diagnosis not present

## 2022-04-16 DIAGNOSIS — E119 Type 2 diabetes mellitus without complications: Secondary | ICD-10-CM | POA: Diagnosis not present

## 2022-04-16 DIAGNOSIS — R140 Abdominal distension (gaseous): Secondary | ICD-10-CM

## 2022-04-16 DIAGNOSIS — R109 Unspecified abdominal pain: Secondary | ICD-10-CM | POA: Diagnosis not present

## 2022-04-16 DIAGNOSIS — M791 Myalgia, unspecified site: Secondary | ICD-10-CM

## 2022-04-16 MED ORDER — FAMOTIDINE 20 MG PO TABS: 20.0000 mg | ORAL_TABLET | Freq: Every day | ORAL | 0 refills | Status: DC

## 2022-04-16 MED ORDER — HYDROCORTISONE (PERIANAL) 2.5 % EX CREA
1.0000 | TOPICAL_CREAM | Freq: Two times a day (BID) | CUTANEOUS | 1 refills | Status: AC
  Filled 2022-04-16: qty 30, 10d supply, fill #0

## 2022-04-16 NOTE — Addendum Note (Signed)
Addended by: Manuela Schwartz on: 04/16/2022 05:16 PM   Modules accepted: Orders

## 2022-04-16 NOTE — Progress Notes (Signed)
Subjective:    Patient ID: Joshua Skinner, male    DOB: 05-Feb-1971, 52 y.o.   MRN: [id number]  HPI  Pt in with some recent abd pain since Friday. Pt states some pain after eating and feels bloated. Pt this morning had loose stool. Pt took Floydada enema last night. Pt states usually has bm every 2-3 times a day. Last 2 days has had bright red blood when has bm since Sunday. Semi formed stool once a day since Sunday.   Pt states has hemorrhoids. He thinks probably inflammed    Describes some tongue soreness for one month. [facility]. Still some soreness. [facility] covid and flu test were negaive.   Pt has some both hands have been hurting all day long. States this has happened for about 6 weeks.   Pt is on zetia and fenofibrate. He thinks maybe side effect. He states in past had side effect to atorvastain for thigh pain. 2 days ago he stopped zetia and he states hand pain resolved. Also with zetia had some mild thigh achinss. This to feels some better as stopped zetia.  Pt truck driver.    Review of Systems  Constitutional:  Negative for chills, fatigue and fever.  Respiratory:  Negative for cough, chest tightness and stridor.   Cardiovascular:  Negative for chest pain and palpitations.  Gastrointestinal:  Positive for abdominal distention, abdominal pain and nausea. Negative for constipation and vomiting.  Genitourinary:  Negative for dysuria.  Musculoskeletal:  Positive for arthralgias and myalgias. Negative for back pain.  Skin:  Negative for rash.  Neurological:  Negative for dizziness, syncope, numbness and headaches.  Hematological:  Negative for adenopathy. Does not bruise/bleed easily.    Past Medical History:  Diagnosis Date   Anxiety    Arthritis    Asthma    GERD (gastroesophageal reflux disease)    Gout    last flare up 2020 per pt   Hemorrhoids    History of COVID-19 03/22/2019   fever, body aches chills x 3  weeks monoclonal iv antibodies given all symptoms resolved   History of COVID-19 07/2019   mild symtpoms all symptosm resolved   Hyperlipidemia    Hypertension    left ureteral stone    Type 2 DM      Social History   Socioeconomic History   Marital status: Married    Spouse name: Not on file   Number of children: 3   Years of education: Not on file   Highest education level: Not on file  Occupational History   Occupation: Truck Geophysicist/field seismologist  Tobacco Use   Smoking status: Never   Smokeless tobacco: Never  Vaping Use   Vaping Use: Never used  Substance and Sexual Activity   Alcohol use: Never   Drug use: Never   Sexual activity: Yes  Other Topics Concern   Not on file  Social History Narrative   ** Merged History Encounter **       Social Determinants of Health   Financial Resource Strain: Not on file  Food Insecurity: Not on file  Transportation Needs: Not on file  Physical Activity: Not on file  Stress: Not on file  Social Connections: Not on file  Intimate Partner Violence: Not on file    Past Surgical History:  Procedure Laterality Date   COLONOSCOPY  08/05/2008   Colon polyp sttus post polypectomy. Small internal hemorrhoids (most likely etiology of bright  red blood per rectum) Rare diverticula in the sigmoid colon.    COLONOSCOPY  08/10/2015   Dr. Noe Gens. [facility], Internal hemorrhoids are noted, inadequate prep, limeted view , Repeat in 5 years   ESOPHAGOGASTRODUODENOSCOPY  06/25/2019   Dr.Shabena Shahid, [facility]. Irregular Z-line, biopsied, Gastritis, biopisied, hiatal hernia   HERNIA REPAIR Bilateral    inguinal yrs agp per pt on 01-11-2021    Family History  Problem Relation Age of Onset   Diabetes Mother    Irritable bowel syndrome Mother    Cirrhosis Mother    Prostate cancer Father    Diabetes Father    Kidney disease Father    Diabetes Sister    Cirrhosis Sister    Pancreatic cancer Neg Hx    Colon cancer Neg Hx     Stomach cancer Neg Hx    Esophageal cancer Neg Hx     Allergies  Allergen Reactions   Vicks Nyquil Cough [Doxylamine-Dm]     insomnia    Current Outpatient Medications on File Prior to Visit  Medication Sig Dispense Refill   Albuterol Sulfate (PROAIR RESPICLICK) 108 (90 Base) MCG/ACT AEPB Inhale 2 puffs into the lungs as needed.     allopurinol (ZYLOPRIM) 300 MG tablet Take 300 mg by mouth daily.     amLODipine (NORVASC) 5 MG tablet Take 1 tablet (5 mg total) by mouth daily. 90 tablet 3   aspirin 81 MG chewable tablet Chew 81 mg by mouth daily.     atorvastatin (LIPITOR) 40 MG tablet Take 1 tablet (40 mg total) by mouth daily. 90 tablet 0   Continuous Blood Gluc Sensor (FREESTYLE LIBRE 14 DAY SENSOR) MISC Change every 14 days 3 each 3   Continuous Blood Gluc Sensor (FREESTYLE LIBRE 2 SENSOR) MISC PLACE 1 DEVICE BY DOES NOT APPLY ROUTE EVERY 14 DAYS 2 each 3   dapagliflozin propanediol (FARXIGA) 10 MG TABS tablet Take 1 tablet (10 mg total) by mouth daily before breakfast. 90 tablet 3   ezetimibe (ZETIA) 10 MG tablet Take 1 tablet (10 mg total) by mouth daily. 90 tablet 3   fenofibrate (TRICOR) 48 MG tablet Take 1 tablet (48 mg total) by mouth daily. 30 tablet 3   gabapentin (NEURONTIN) 100 MG capsule TAKE 1 CAPSULE BY MOUTH IN THE MORNING AND 2 CAPSULES AT NIGHT 90 capsule 0   glipiZIDE (GLUCOTROL) 5 MG tablet Take 1 tablet (5 mg total) by mouth 2 (two) times daily before a meal. 180 tablet 3   HYDROcodone-acetaminophen (NORCO) 5-325 MG tablet Take 1 tablet by mouth every 6 (six) hours as needed for moderate pain. 16 tablet 0   lisinopril (ZESTRIL) 20 MG tablet Take 1 tablet (20 mg total) by mouth daily. 90 tablet 3   omeprazole (PRILOSEC) 40 MG capsule Take 1 capsule (40 mg total) by mouth daily as needed. 90 capsule 3   ondansetron (ZOFRAN ODT) 4 MG disintegrating tablet Take 1 tablet (4 mg total) by mouth every 8 (eight) hours as needed for nausea or vomiting. 20 tablet 0    sildenafil (VIAGRA) 50 MG tablet Take 1 tablet (50 mg total) by mouth daily as needed for erectile dysfunction. 10 tablet 2   SUMAtriptan (IMITREX) 20 MG/ACT nasal spray MAY REPEAT IN 2 HOURS IF HEADACHE PERSISTS OR RECURS 6 each 0   tamsulosin (FLOMAX) 0.4 MG CAPS capsule TAKE 1 CAPSULE BY MOUTH AT BEDTIME *OVERDUE  FOR  VISIT* 15 capsule 0   tamsulosin (FLOMAX) 0.4 MG CAPS  capsule Take 1 capsule (0.4 mg total) by mouth daily. 30 capsule 3   traZODone (DESYREL) 50 MG tablet Take 0.5-1 tablets (25-50 mg total) by mouth at bedtime as needed for sleep. 30 tablet 3   No current facility-administered medications on file prior to visit.    BP 117/74   Pulse 89   Temp 97.6 F (36.4 C)   Resp 18   Ht 5\' 7"  (1.702 m)   Wt 205 lb 12.8 oz (93.4 kg)   SpO2 98%   BMI 32.23 kg/m        Objective:   Physical Exam  General Mental Status- Alert. General Appearance- Not in acute distress.   Skin General: Color- Normal Color. Moisture- Normal Moisture.  Neck Carotid Arteries- Normal color. Moisture- Normal Moisture. No carotid bruits. No JVD.  Chest and Lung Exam Auscultation: Breath Sounds:-Normal.  Cardiovascular Auscultation:Rythm- Regular. Murmurs & Other Heart Sounds:Auscultation of the heart reveals- No Murmurs.  Abdomen Inspection:-Inspeection Normal. Palpation/Percussion:Note:No mass. Palpation and Percussion of the abdomen reveal- Non Tender, Non Distended + BS, no rebound or guarding.  Neurologic Cranial Nerve exam:- CN III-XII intact(No nystagmus), symmetric smile. Strength:- 5/5 equal and symmetric strength both upper and lower extremities.   Hands- both tender on mild sqeeezing. Normal grip strength.  Mouth- no lesions buccal mucosa. Tongue-mild glossitis type appearnace.  Rectum- 3 large inflammed hemorrhoids but not thrombosed.    Assessment & Plan:   Patient Instructions  1. Generalized abdominal pain With change in bm. Maybe some constipation. Will assess  stool burden and labs. Since pain in epigastric area after eating will rx famotadine - DG Abd 1 View; Future - Comp Met (CMET) - CBC w/Diff - Lipase  2. Bloating Same plan as above. If stool burden matches constipation will advise med to take.   3. Arthralgia, unspecified joint Maybe med side effect zetia but will check inflammatory - Sedimentation rate - C-reactive protein - ANA - Rheumatoid factor    4. Fatigue, unspecified type Cbc and cmp. As well as b vitamins.  5. Myalgia - CK (Creatine Kinase)  6. Hemorrhoids, unspecified hemorrhoid type- 3 large hemorrhoids but not thrombosed. Rx proctozone hc cream to use tid. If pain not easing up will try to get you in with specialist. Give me update by Thursday morning.   7. Glossitis Continue magic mouth wash. Will follow. - B12 - Vitamin B1   8. Hemorrhoids- inflammed hemmorhoid but not thrombosed.   Follow up in one week or sooner if needed.  Time spent with patient today was 41 minutes which consisted of chart revdiew, discussing diagnosis, work up treatment and documentation.    Mackie Pai, PA-C

## 2022-04-16 NOTE — Patient Instructions (Addendum)
1. Generalized abdominal pain With change in bm. Maybe some constipation. Will assess stool burden and labs. Since pain in epigastric area after eating will rx famotadine - DG Abd 1 View; Future - Comp Met (CMET) - CBC w/Diff - Lipase  2. Bloating Same plan as above. If stool burden matches constipation will advise med to take.   3. Arthralgia, unspecified joint Maybe med side effect zetia but will check inflammatory - Sedimentation rate - C-reactive protein - ANA - Rheumatoid factor    4. Fatigue, unspecified type Cbc and cmp. As well as b vitamins.  5. Myalgia - CK (Creatine Kinase)  6. Hemorrhoids, unspecified hemorrhoid type- 3 large hemorrhoids but not thrombosed. Rx proctozone hc cream to use tid. If pain not easing up will try to get you in with specialist. Give me update by Thursday morning.   7. Glossitis Continue magic mouth wash. Will follow. - B12 - Vitamin B1   8. Hemorrhoids- inflammed hemmorhoid but not thrombosed.  Follow up in one week or sooner if needed.

## 2022-04-16 NOTE — Addendum Note (Signed)
Addended by: Manuela Schwartz on: 04/16/2022 05:30 PM   Modules accepted: Orders

## 2022-04-16 NOTE — Addendum Note (Signed)
Addended by: Jeronimo Greaves on: 04/16/2022 04:32 PM   Modules accepted: Orders

## 2022-04-16 NOTE — Addendum Note (Signed)
Addended by: Anabel Halon on: 04/16/2022 04:25 PM   Modules accepted: Orders

## 2022-04-17 LAB — C-REACTIVE PROTEIN: CRP: <1 mg/dL (ref 0.5–20.0)

## 2022-04-17 LAB — CBC WITH DIFFERENTIAL/PLATELET
Absolute Monocytes: 548 cells/uL (ref 200–950)
Basophils Absolute: 19 cells/uL (ref 0–200)
Basophils Relative: 0.3 %
Eosinophils Absolute: 252 cells/uL (ref 15–500)
Eosinophils Relative: 4 %
HCT: 45.4 % (ref 38.5–50.0)
Hemoglobin: 15.5 g/dL (ref 13.2–17.1)
Lymphs Abs: 1676 cells/uL (ref 850–3900)
MCH: 28.9 pg (ref 27.0–33.0)
MCHC: 34.1 g/dL (ref 32.0–36.0)
MCV: 84.5 fL (ref 80.0–100.0)
MPV: 11.3 fL (ref 7.5–12.5)
Monocytes Relative: 8.7 %
Neutro Abs: 3805 cells/uL (ref 1500–7800)
Neutrophils Relative %: 60.4 %
Platelets: 221 10*3/uL (ref 140–400)
RBC: 5.37 10*6/uL (ref 4.20–5.80)
RDW: 13.3 % (ref 11.0–15.0)
Total Lymphocyte: 26.6 %
WBC: 6.3 10*3/uL (ref 3.8–10.8)

## 2022-04-17 LAB — ANA: Anti Nuclear Antibody (ANA): NEGATIVE

## 2022-04-17 LAB — COMPREHENSIVE METABOLIC PANEL
AG Ratio: 1.6 (calc) (ref 1.0–2.5)
ALT: 22 U/L (ref 9–46)
AST: 18 U/L (ref 10–35)
Albumin: 4.5 g/dL (ref 3.6–5.1)
Alkaline phosphatase (APISO): 37 U/L (ref 35–144)
BUN/Creatinine Ratio: 20 (calc) (ref 6–22)
BUN: 26 mg/dL — ABNORMAL HIGH (ref 7–25)
CO2: 17 mmol/L — ABNORMAL LOW (ref 20–32)
Calcium: 9.7 mg/dL (ref 8.6–10.3)
Chloride: 110 mmol/L (ref 98–110)
Creat: 1.3 mg/dL (ref 0.70–1.30)
Globulin: 2.8 g/dL (calc) (ref 1.9–3.7)
Glucose, Bld: 114 mg/dL — ABNORMAL HIGH (ref 65–99)
Potassium: 4.3 mmol/L (ref 3.5–5.3)
Sodium: 142 mmol/L (ref 135–146)
Total Bilirubin: 0.4 mg/dL (ref 0.2–1.2)
Total Protein: 7.3 g/dL (ref 6.1–8.1)

## 2022-04-17 LAB — VITAMIN B12: Vitamin B-12: 458 pg/mL (ref 211–911)

## 2022-04-17 LAB — SEDIMENTATION RATE: Sed Rate: 20 mm/hr (ref 0–20)

## 2022-04-17 LAB — MICROALBUMIN, URINE: Microalb, Ur: 0.9 mg/dL

## 2022-04-17 LAB — CK: Total CK: 98 U/L (ref 7–232)

## 2022-04-17 LAB — LIPASE: Lipase: 20 U/L (ref 7–60)

## 2022-04-17 LAB — RHEUMATOID FACTOR: Rheumatoid fact SerPl-aCnc: <14 IU/mL (ref <14)

## 2022-04-18 ENCOUNTER — Encounter: Payer: Self-pay | Admitting: Medical

## 2022-04-18 NOTE — Addendum Note (Signed)
Addended by: Anabel Halon on: 04/18/2022 04:20 PM   Modules accepted: Orders

## 2022-04-20 LAB — VITAMIN B1: Vitamin B1 (Thiamine): 14 nmol/L (ref 8–30)

## 2022-04-22 ENCOUNTER — Ambulatory Visit: Payer: 59 | Admitting: Medical

## 2022-04-23 NOTE — Addendum Note (Signed)
Addended by: Anabel Halon on: 04/23/2022 12:38 PM   Modules accepted: Orders

## 2022-05-03 ENCOUNTER — Telehealth: Payer: Self-pay

## 2022-05-03 MED ORDER — EMPAGLIFLOZIN 25 MG PO TABS: 25.0000 mg | ORAL_TABLET | Freq: Every day | ORAL | 1 refills | Status: DC

## 2022-05-03 NOTE — Addendum Note (Signed)
Addended by: Dorita Sciara on: 05/03/2022 02:00 PM   Modules accepted: Orders

## 2022-05-03 NOTE — Telephone Encounter (Signed)
Insurance prefers [place] instead of [place] okay to change.

## 2022-05-11 DIAGNOSIS — J029 Acute pharyngitis, unspecified: Secondary | ICD-10-CM | POA: Diagnosis not present

## 2022-05-11 DIAGNOSIS — Z6832 Body mass index (BMI) 32.0-32.9, adult: Secondary | ICD-10-CM | POA: Diagnosis not present

## 2022-05-13 ENCOUNTER — Other Ambulatory Visit: Payer: Self-pay | Admitting: Medical

## 2022-05-18 ENCOUNTER — Other Ambulatory Visit: Payer: Self-pay | Admitting: Internal Medicine

## 2022-05-23 ENCOUNTER — Other Ambulatory Visit: Payer: Self-pay

## 2022-05-23 DIAGNOSIS — E119 Type 2 diabetes mellitus without complications: Secondary | ICD-10-CM | POA: Insufficient documentation

## 2022-05-23 DIAGNOSIS — M1990 Unspecified osteoarthritis, unspecified site: Secondary | ICD-10-CM | POA: Insufficient documentation

## 2022-05-23 DIAGNOSIS — K649 Unspecified hemorrhoids: Secondary | ICD-10-CM | POA: Insufficient documentation

## 2022-05-23 DIAGNOSIS — F419 Anxiety disorder, unspecified: Secondary | ICD-10-CM | POA: Insufficient documentation

## 2022-05-23 DIAGNOSIS — E785 Hyperlipidemia, unspecified: Secondary | ICD-10-CM | POA: Insufficient documentation

## 2022-05-23 DIAGNOSIS — J45909 Unspecified asthma, uncomplicated: Secondary | ICD-10-CM | POA: Insufficient documentation

## 2022-05-23 DIAGNOSIS — M109 Gout, unspecified: Secondary | ICD-10-CM | POA: Insufficient documentation

## 2022-05-23 DIAGNOSIS — K219 Gastro-esophageal reflux disease without esophagitis: Secondary | ICD-10-CM | POA: Insufficient documentation

## 2022-05-23 DIAGNOSIS — Z87442 Personal history of urinary calculi: Secondary | ICD-10-CM | POA: Insufficient documentation

## 2022-05-23 DIAGNOSIS — I10 Essential (primary) hypertension: Secondary | ICD-10-CM | POA: Insufficient documentation

## 2022-05-30 ENCOUNTER — Ambulatory Visit: Payer: 59 | Attending: Cardiology | Admitting: Cardiology

## 2022-05-30 ENCOUNTER — Encounter: Payer: Self-pay | Admitting: Cardiology

## 2022-05-30 VITALS — BP 110/62 | HR 91 | Ht 67.0 in | Wt 209.1 lb

## 2022-05-30 DIAGNOSIS — E1165 Type 2 diabetes mellitus with hyperglycemia: Secondary | ICD-10-CM | POA: Diagnosis not present

## 2022-05-30 DIAGNOSIS — I10 Essential (primary) hypertension: Secondary | ICD-10-CM

## 2022-05-30 DIAGNOSIS — E782 Mixed hyperlipidemia: Secondary | ICD-10-CM

## 2022-05-30 DIAGNOSIS — R011 Cardiac murmur, unspecified: Secondary | ICD-10-CM | POA: Diagnosis not present

## 2022-05-30 DIAGNOSIS — E781 Pure hyperglyceridemia: Secondary | ICD-10-CM

## 2022-05-30 DIAGNOSIS — E669 Obesity, unspecified: Secondary | ICD-10-CM | POA: Diagnosis not present

## 2022-05-30 MED ORDER — PITAVASTATIN CALCIUM 1 MG PO TABS: 1.0000 mg | ORAL_TABLET | Freq: Every day | ORAL | 12 refills | Status: AC

## 2022-05-30 NOTE — Patient Instructions (Signed)
Medication Instructions:  Your physician has recommended you make the following change in your medication:   Start Livalo 1 mg daily.  *If you need a refill on your cardiac medications before your next appointment, please call your pharmacy*   Lab Work: Your physician recommends that you have labs done today for a CMP and TSH.  Your physician recommends that you return for lab work in: 6 weeks for CMP and lipids. You need to have labs done when you are fasting. [facility] lab is located on the 3rd floor, [address]. Hours are Monday - Friday 8 am to 4 pm, closed 11:30 am to 1:00 pm. You do NOT need an appointment.    If you have labs (blood work) drawn today and your tests are completely normal, you will receive your results only by: [place] (if you have MyChart) OR A paper copy in the mail If you have any lab test that is abnormal or we need to change your treatment, we will call you to review the results.   Testing/Procedures: None ordered   Follow-Up: At [facility], you and your health needs are our priority.  As part of our continuing mission to provide you with exceptional heart care, we have created designated Provider Care Teams.  These Care Teams include your primary Cardiologist (physician) and Advanced Practice Providers (APPs -  Physician Assistants and Nurse Practitioners) who all work together to provide you with the care you need, when you need it.  We recommend signing up for the patient portal called "MyChart".  Sign up information is provided on this After Visit Summary.  MyChart is used to connect with patients for Virtual Visits (Telemedicine).  Patients are able to view lab/test results, encounter notes, upcoming appointments, etc.  Non-urgent messages can be sent to your provider as well.   To learn more about what you can do with MyChart, go to [email or web address].    Your next appointment:   9 month(s)  The format for your next  appointment:   In Person  Provider:   Jyl Heinz, MD    Other Instructions none  Important Information About Sugar

## 2022-05-30 NOTE — Progress Notes (Signed)
Cardiology Office Note:    Date:  05/30/2022   ID:  Joshua Skinner, DOB 1970/07/07, MRN [id number]  PCP:  Joshua Pai, PA-C  Cardiologist:  Joshua Lindau, MD   Referring MD: Joshua Pai, PA-C    ASSESSMENT:    1. Primary hypertension   2. Type 2 diabetes mellitus with hyperglycemia, without long-term current use of insulin (HCC)   3. Obesity (BMI 30-39.9)   4. Mixed hyperlipidemia   5. Cardiac murmur    PLAN:    In order of problems listed above:  Primary prevention stressed with the patient.  Importance of compliance with diet medication stressed any vocalized understanding.  He was advised to walk at least half an hour a day 5 days a week and he promises to do so. Cardiac murmur: Echocardiogram will be done to assess murmur heard on auscultation. Mixed dyslipidemia: He has not taken statin since November.  Will get baseline blood work today and start him on Livalo 1 mg daily.  He was advised to take over-the-counter co-Q10 on a daily basis.  He will let us know how he feels in a week or.  If his symptoms resume then we will stop statin and go for alternative therapy such as Zetia and or Nexletol. Diabetes mellitus: Managed by primary care.  Diet emphasized. Essential hypertension: Blood pressure stable and diet was emphasized.  Lifestyle modification urged. Obesity: Weight reduction stressed and the patient promises to do better. Patient will be seen in follow-up appointment in 6 months or earlier if the patient has any concerns    Medication Adjustments/Labs and Tests Ordered: Current medicines are reviewed at length with the patient today.  Concerns regarding medicines are outlined above.  Orders Placed This Encounter  Procedures   Comprehensive metabolic panel   Lipid panel   TSH   EKG 12-Lead   Meds ordered this encounter  Medications   Pitavastatin Calcium (LIVALO) 1 MG TABS    Sig: Take 1 tablet (1 mg total) by mouth daily.    Dispense:  30 tablet     Refill:  12     History of Present Illness:    Joshua Skinner is a 52 y.o. male who is being seen today for the evaluation of hyperlipidemia not tolerating statin therapy.  At the request of Joshua Skinner.  Patient is a pleasant 52 year old male.  He has past medical history of essential hypertension, mixed dyslipidemia and diabetes mellitus.  He is a Administrator by profession.  He does not smoke.  He mentions to me that he could not tolerate atorvastatin and rosuvastatin and therefore he was sent here for evaluation.  He gets some muscle aches with this.  No chest pain orthopnea or PND.  He takes care of activities of daily living.  He does not exercise on a regular basis.  At the time of my evaluation, the patient is alert awake oriented and in no distress.  Past Medical History:  Diagnosis Date   Acute idiopathic gout of left elbow 03/09/2018   AKI (acute kidney injury) ([place]) 02/23/2021   Anxiety    Arthritis    Asthma    COVID-19 05/11/2020   Diabetes mellitus ([place]) 01/29/2021   Enterocolitis 07/10/2020   GERD (gastroesophageal reflux disease)    Gout    last flare up 2020 per pt   Hemorrhoids    History of COVID-19 03/22/2019   fever, body aches chills x 3 weeks monoclonal iv antibodies given all symptoms resolved  History of COVID-19 07/2019   mild symtpoms all symptosm resolved   Hyperlipidemia    Hypertension    Kidney stones 04/29/2017   left ureteral stone    Need for immunization against influenza 01/29/2021   Obesity (BMI 30-39.9) 07/13/2020   Olecranon bursitis of left elbow 05/17/2014   Pancreatitis 07/10/2020   Right ureteral stone 11/27/2017   Synovitis of elbow 01/23/2018   Type 2 diabetes mellitus with hyperglycemia, without long-term current use of insulin ([place]) 01/29/2021   Type 2 DM     Past Surgical History:  Procedure Laterality Date   COLONOSCOPY  08/05/2008   Colon polyp sttus post polypectomy. Small internal hemorrhoids (most likely  etiology of bright red blood per rectum) Rare diverticula in the sigmoid colon.    COLONOSCOPY  08/10/2015   Dr. Ferdinand Joshua Skinner. [facility], Internal hemorrhoids are noted, inadequate prep, limeted view , Repeat in 5 years   ESOPHAGOGASTRODUODENOSCOPY  06/25/2019   [place], [facility]. Irregular Z-line, biopsied, Gastritis, biopisied, hiatal hernia   HERNIA REPAIR Bilateral    inguinal yrs agp per pt on 01-11-2021    Current Medications: Current Meds  Medication Sig   Albuterol Sulfate (PROAIR RESPICLICK) [id number] (90 Base) MCG/ACT AEPB Inhale 2 puffs into the lungs as needed (wheezing or shortness of breath).   allopurinol (ZYLOPRIM) 300 MG tablet Take 300 mg by mouth daily.   aspirin 81 MG chewable tablet Chew 81 mg by mouth daily.   Continuous Blood Gluc Sensor (FREESTYLE LIBRE 14 DAY SENSOR) MISC Change every 14 days   Continuous Blood Gluc Sensor (FREESTYLE LIBRE 2 SENSOR) MISC PLACE 1 DEVICE BY DOES NOT APPLY ROUTE EVERY 14 DAYS   dapagliflozin propanediol (FARXIGA) 10 MG TABS tablet Take 10 mg by mouth daily.   famotidine (PEPCID) 20 MG tablet Take 1 tablet by mouth once daily   fenofibrate (TRICOR) 48 MG tablet Take 1 tablet (48 mg total) by mouth daily.   gabapentin (NEURONTIN) 100 MG capsule TAKE 1 CAPSULE BY MOUTH IN MORNING, TAKE 2 CAPSULES BY MOUTH IN EVENING   glipiZIDE (GLUCOTROL) 5 MG tablet Take 1 tablet (5 mg total) by mouth 2 (two) times daily before a meal.   HYDROcodone-acetaminophen (NORCO) 5-325 MG tablet Take 1 tablet by mouth every 6 (six) hours as needed for moderate pain.   hydrocortisone (PROCTOZONE-HC) 2.5 % rectal cream Place 1 Application rectally 2 (two) times daily.   lisinopril (ZESTRIL) 20 MG tablet Take 1 tablet (20 mg total) by mouth daily.   omeprazole (PRILOSEC) 40 MG capsule Take 1 capsule (40 mg total) by mouth daily as needed.   ondansetron (ZOFRAN ODT) 4 MG disintegrating tablet Take 1 tablet (4 mg total) by mouth every 8  (eight) hours as needed for nausea or vomiting.   Pitavastatin Calcium (LIVALO) 1 MG TABS Take 1 tablet (1 mg total) by mouth daily.   sildenafil (VIAGRA) 50 MG tablet TAKE 1 TABLET BY MOUTH ONCE DAILY AS NEEDED FOR ERECTILE DYSFUNCTION   SUMAtriptan (IMITREX) 20 MG/ACT nasal spray MAY REPEAT IN 2 HOURS IF HEADACHE PERSISTS OR RECURS   tamsulosin (FLOMAX) 0.4 MG CAPS capsule TAKE 1 CAPSULE BY MOUTH AT BEDTIME *OVERDUE  FOR  VISIT*   tamsulosin (FLOMAX) 0.4 MG CAPS capsule Take 1 capsule (0.4 mg total) by mouth daily.   traZODone (DESYREL) 50 MG tablet Take 0.5-1 tablets (25-50 mg total) by mouth at bedtime as needed for sleep.     Allergies:   Vicks nyquil cough [doxylamine-dm]  Social History   Socioeconomic History   Marital status: Married    Spouse name: Not on file   Number of children: 3   Years of education: Not on file   Highest education level: Not on file  Occupational History   Occupation: Truck Geophysicist/field seismologist  Tobacco Use   Smoking status: Never   Smokeless tobacco: Never  Vaping Use   Vaping Use: Never used  Substance and Sexual Activity   Alcohol use: Never   Drug use: Never   Sexual activity: Yes  Other Topics Concern   Not on file  Social History Narrative   ** Merged History Encounter **       Social Determinants of Health   Financial Resource Strain: Not on file  Food Insecurity: Not on file  Transportation Needs: Not on file  Physical Activity: Not on file  Stress: Not on file  Social Connections: Not on file     Family History: The patient's family history includes Cirrhosis in his mother and sister; Diabetes in his father, mother, and sister; Irritable bowel syndrome in his mother; Kidney disease in his father; Prostate cancer in his father. There is no history of Pancreatic cancer, Colon cancer, Stomach cancer, or Esophageal cancer.  ROS:   Please see the history of present illness.    All other systems reviewed and are negative.  EKGs/Labs/Other  Studies Reviewed:    The following studies were reviewed today: EKG with sinus rhythm and nonspecific ST-T changes   Recent Labs: 04/16/2022: ALT 22; BUN 26; Creat 1.30; Hemoglobin 15.5; Platelets 221; Potassium 4.3; Sodium 142  Recent Lipid Panel    Component Value Date/Time   CHOL 181 02/11/2022 0945   TRIG 392.0 (H) 02/11/2022 0945   HDL 32.50 (L) 02/11/2022 0945   CHOLHDL 6 02/11/2022 0945   VLDL 78.4 (H) 02/11/2022 0945   LDLCALC 42 01/28/2020 1633   LDLDIRECT 79.0 02/11/2022 0945    Physical Exam:    VS:  BP 110/62   Pulse 91   Ht '5\' 7"'$  (1.702 m)   Wt 209 lb 1.3 oz (94.8 kg)   SpO2 95%   BMI 32.75 kg/m     Wt Readings from Last 3 Encounters:  05/30/22 209 lb 1.3 oz (94.8 kg)  04/16/22 205 lb 12.8 oz (93.4 kg)  02/11/22 211 lb (95.7 kg)     GEN: Patient is in no acute distress HEENT: Normal NECK: No JVD; No carotid bruits LYMPHATICS: No lymphadenopathy CARDIAC: S1 S2 regular, 2/6 systolic murmur at the apex. RESPIRATORY:  Clear to auscultation without rales, wheezing or rhonchi  ABDOMEN: Soft, non-tender, non-distended MUSCULOSKELETAL:  No edema; No deformity  SKIN: Warm and dry NEUROLOGIC:  Alert and oriented x 3 PSYCHIATRIC:  Normal affect    Signed, Joshua Lindau, MD  05/30/2022 11:07 AM    [place]

## 2022-05-30 NOTE — Addendum Note (Signed)
Addended by: Truddie Hidden on: 05/30/2022 02:58 PM   Modules accepted: Orders

## 2022-05-31 LAB — COMPREHENSIVE METABOLIC PANEL
ALT: 20 IU/L (ref 0–44)
AST: 19 IU/L (ref 0–40)
Albumin/Globulin Ratio: 1.9 (ref 1.2–2.2)
Albumin: 4.8 g/dL (ref 3.8–4.9)
Alkaline Phosphatase: 47 IU/L (ref 44–121)
BUN/Creatinine Ratio: 20 (ref 9–20)
BUN: 27 mg/dL — ABNORMAL HIGH (ref 6–24)
Bilirubin Total: 0.3 mg/dL (ref 0.0–1.2)
CO2: 16 mmol/L — ABNORMAL LOW (ref 20–29)
Calcium: 9.5 mg/dL (ref 8.7–10.2)
Chloride: 107 mmol/L — ABNORMAL HIGH (ref 96–106)
Creatinine, Ser: 1.36 mg/dL — ABNORMAL HIGH (ref 0.76–1.27)
Globulin, Total: 2.5 g/dL (ref 1.5–4.5)
Glucose: 206 mg/dL — ABNORMAL HIGH (ref 70–99)
Potassium: 4.4 mmol/L (ref 3.5–5.2)
Sodium: 141 mmol/L (ref 134–144)
Total Protein: 7.3 g/dL (ref 6.0–8.5)
eGFR: 63 mL/min/{1.73_m2} (ref >59)

## 2022-05-31 LAB — LIPID PANEL
Chol/HDL Ratio: 6.3 ratio — ABNORMAL HIGH (ref 0.0–5.0)
Cholesterol, Total: 200 mg/dL — ABNORMAL HIGH (ref 100–199)
HDL: 32 mg/dL — ABNORMAL LOW (ref >39)
LDL Chol Calc (NIH): 100 mg/dL — ABNORMAL HIGH (ref 0–99)
Triglycerides: 403 mg/dL — ABNORMAL HIGH (ref 0–149)
VLDL Cholesterol Cal: 68 mg/dL — ABNORMAL HIGH (ref 5–40)

## 2022-05-31 LAB — TSH: TSH: 0.87 u[IU]/mL (ref 0.450–4.500)

## 2022-06-02 ENCOUNTER — Encounter: Payer: Self-pay | Admitting: Medical

## 2022-06-03 MED ORDER — GABAPENTIN 100 MG PO CAPS: ORAL_CAPSULE | ORAL | 3 refills | Status: DC

## 2022-06-03 MED ORDER — FENOFIBRATE 160 MG PO TABS: 160.0000 mg | ORAL_TABLET | Freq: Every day | ORAL | 3 refills | Status: AC

## 2022-06-03 MED ORDER — FISH OIL 1000 MG PO CAPS: 2.0000 | ORAL_CAPSULE | Freq: Two times a day (BID) | ORAL | 12 refills | Status: AC

## 2022-06-03 NOTE — Telephone Encounter (Signed)
-----   Message from Jenean Lindau, MD sent at 06/03/2022  2:19 PM EST ----- Patient needs to be on fish oil ----- Message ----- From: Truddie Hidden, RN Sent: 06/03/2022  10:01 AM EST To: Jenean Lindau, MD  What is 2 g twice daily? ----- Message ----- From: Jenean Lindau, MD Sent: 06/03/2022   9:32 AM EST To: Truddie Hidden, RN  Markedly elevated triglycerides.  Cut down carbs and fried fatty foods in the diet.  Increase fenofibrate dose to highest dose.  Patient 2 g twice daily.  Diet and exercise.  Recheck in 6 weeks.  Copy primary care Jenean Lindau, MD 06/03/2022 9:32 AM

## 2022-06-05 DIAGNOSIS — E119 Type 2 diabetes mellitus without complications: Secondary | ICD-10-CM | POA: Diagnosis not present

## 2022-06-05 DIAGNOSIS — I10 Essential (primary) hypertension: Secondary | ICD-10-CM | POA: Diagnosis not present

## 2022-06-05 DIAGNOSIS — Z6832 Body mass index (BMI) 32.0-32.9, adult: Secondary | ICD-10-CM | POA: Diagnosis not present

## 2022-06-05 DIAGNOSIS — R509 Fever, unspecified: Secondary | ICD-10-CM | POA: Diagnosis not present

## 2022-06-05 DIAGNOSIS — U071 COVID-19: Secondary | ICD-10-CM | POA: Diagnosis not present

## 2022-06-05 DIAGNOSIS — E785 Hyperlipidemia, unspecified: Secondary | ICD-10-CM | POA: Diagnosis not present

## 2022-06-12 ENCOUNTER — Encounter: Payer: Self-pay | Admitting: Internal Medicine

## 2022-06-13 MED ORDER — EMPAGLIFLOZIN 25 MG PO TABS: 25.0000 mg | ORAL_TABLET | Freq: Every day | ORAL | 3 refills | Status: DC

## 2022-06-14 ENCOUNTER — Ambulatory Visit (HOSPITAL_BASED_OUTPATIENT_CLINIC_OR_DEPARTMENT_OTHER)
Admission: RE | Admit: 2022-06-14 | Discharge: 2022-06-14 | Disposition: A | Discharge status: Home / Self Care | Payer: 59 | Source: Ambulatory Visit | Attending: Cardiology | Admitting: Cardiology

## 2022-06-14 DIAGNOSIS — R011 Cardiac murmur, unspecified: Secondary | ICD-10-CM

## 2022-06-14 LAB — ECHOCARDIOGRAM COMPLETE
Area-P 1/2: 2.85 cm2
S' Lateral: 2.4 cm

## 2022-06-17 ENCOUNTER — Telehealth: Payer: Self-pay

## 2022-06-17 ENCOUNTER — Other Ambulatory Visit (HOSPITAL_COMMUNITY): Payer: Self-pay

## 2022-06-17 NOTE — Telephone Encounter (Signed)
Pharmacy Patient Advocate Encounter   Received notification from [facility] that prior authorization for JARDIANCE 25 MG TAB is needed.    PA submitted on 06/17/22 [id number] Status is pending  Karie Soda, [place] Patient Advocate Specialist Direct Number: [phone] Fax: [phone]

## 2022-06-17 NOTE — Telephone Encounter (Signed)
Pharmacy Patient Advocate Encounter  Prior Authorization for JARDIANCE 25 MG TAB  has been approved.    PA# [id number] Effective dates: 06/17/22 through 06/17/22  Karie Soda, [place] Patient Advocate Specialist Direct Number: [phone] Fax: [phone]

## 2022-07-22 DIAGNOSIS — H66002 Acute suppurative otitis media without spontaneous rupture of ear drum, left ear: Secondary | ICD-10-CM | POA: Diagnosis not present

## 2022-07-22 DIAGNOSIS — J029 Acute pharyngitis, unspecified: Secondary | ICD-10-CM | POA: Diagnosis not present

## 2022-07-22 DIAGNOSIS — Z6832 Body mass index (BMI) 32.0-32.9, adult: Secondary | ICD-10-CM | POA: Diagnosis not present

## 2022-08-06 ENCOUNTER — Ambulatory Visit: Payer: 59 | Admitting: Medical

## 2022-08-06 ENCOUNTER — Ambulatory Visit (HOSPITAL_BASED_OUTPATIENT_CLINIC_OR_DEPARTMENT_OTHER)
Admission: RE | Admit: 2022-08-06 | Discharge: 2022-08-06 | Disposition: A | Discharge status: Home / Self Care | Payer: 59 | Source: Ambulatory Visit | Attending: Medical | Admitting: Medical

## 2022-08-06 VITALS — BP 130/80 | HR 80 | Temp 98.2°F | Resp 18 | Ht 67.0 in | Wt 211.0 lb

## 2022-08-06 DIAGNOSIS — R439 Unspecified disturbances of smell and taste: Secondary | ICD-10-CM | POA: Diagnosis not present

## 2022-08-06 DIAGNOSIS — M79642 Pain in left hand: Secondary | ICD-10-CM

## 2022-08-06 DIAGNOSIS — M25541 Pain in joints of right hand: Secondary | ICD-10-CM | POA: Diagnosis not present

## 2022-08-06 DIAGNOSIS — E1169 Type 2 diabetes mellitus with other specified complication: Secondary | ICD-10-CM | POA: Diagnosis not present

## 2022-08-06 DIAGNOSIS — M79641 Pain in right hand: Secondary | ICD-10-CM | POA: Diagnosis not present

## 2022-08-06 DIAGNOSIS — E785 Hyperlipidemia, unspecified: Secondary | ICD-10-CM

## 2022-08-06 LAB — URIC ACID: Uric Acid, Serum: 4.6 mg/dL (ref 4.0–7.8)

## 2022-08-06 LAB — C-REACTIVE PROTEIN: CRP: <1 mg/dL (ref 0.5–20.0)

## 2022-08-06 LAB — SEDIMENTATION RATE: Sed Rate: 12 mm/hr (ref 0–20)

## 2022-08-06 MED ORDER — AZITHROMYCIN 250 MG PO TABS: ORAL_TABLET | ORAL | 0 refills | Status: AC

## 2022-08-06 MED ORDER — FLUTICASONE PROPIONATE 50 MCG/ACT NA SUSP: 2.0000 | Freq: Every day | NASAL | 1 refills | Status: AC

## 2022-08-06 NOTE — Progress Notes (Signed)
Subjective:    Patient ID: Cailan Student, male    DOB: 09/04/1970, 52 y.o.   MRN: [id number]  HPI  Pt in for recent change in smell. He states will come and go. He states he had that when he had covid in 2020. But no covid like signs and symptoms presently. This has been going on for 1 week. This will occur randomly for 5-10 minutes then will go away. Pt denies nasal congested. No sinus pain. No teeth pain. No fever, no chills, no sweats, no headache or neurologic signs or symptoms.  Pt states rt pinkey is stiff ans some tender. Pt states at night his rt 5th digit is very sensitive if hits something at night. During the day does not appear as tender. Pt rt 5th digit seems more contracted.  Pt does have hx of gout 14 years ago.  Pt states in the morning both hands are very stiff. Pt states yesterday trying to remove screw from sump pump and had severe pain.    Review of Systems  Constitutional:  Negative for chills, fatigue and fever.  HENT:  Negative for congestion and ear pain.        Left ear infection 2 weeks ago. Took antibiotic. Now no pain.  Transient change in smell.  Respiratory:  Negative for cough, chest tightness and wheezing.   Cardiovascular:  Negative for chest pain and palpitations.  Gastrointestinal:  Negative for abdominal pain.  Genitourinary:  Negative for dysuria, flank pain, frequency, penile discharge and penile pain.  Musculoskeletal:  Negative for back pain, gait problem, myalgias and neck stiffness.  Skin:  Negative for rash.  Neurological:  Negative for dizziness, seizures, speech difficulty, weakness and light-headedness.  Hematological:  Negative for adenopathy. Does not bruise/bleed easily.  Psychiatric/Behavioral:  Negative for behavioral problems and decreased concentration. The patient is not nervous/anxious.     Past Medical History:  Diagnosis Date   Acute idiopathic gout of left elbow 03/09/2018   AKI (acute kidney injury) (HCC) 02/23/2021    Anxiety    Arthritis    Asthma    COVID-19 05/11/2020   Diabetes mellitus (HCC) 01/29/2021   Enterocolitis 07/10/2020   GERD (gastroesophageal reflux disease)    Gout    last flare up 2020 per pt   Hemorrhoids    History of COVID-19 03/22/2019   fever, body aches chills x 3 weeks monoclonal iv antibodies given all symptoms resolved   History of COVID-19 07/2019   mild symtpoms all symptosm resolved   Hyperlipidemia    Hypertension    Kidney stones 04/29/2017   left ureteral stone    Need for immunization against influenza 01/29/2021   Obesity (BMI 30-39.9) 07/13/2020   Olecranon bursitis of left elbow 05/17/2014   Pancreatitis 07/10/2020   Right ureteral stone 11/27/2017   Synovitis of elbow 01/23/2018   Type 2 diabetes mellitus with hyperglycemia, without long-term current use of insulin (HCC) 01/29/2021   Type 2 DM      Social History   Socioeconomic History   Marital status: Married    Spouse name: Not on file   Number of children: 3   Years of education: Not on file   Highest education level: Not on file  Occupational History   Occupation: Truck Hospital doctor  Tobacco Use   Smoking status: Never   Smokeless tobacco: Never  Vaping Use   Vaping Use: Never used  Substance and Sexual Activity   Alcohol use: Never   Drug use: Never  Sexual activity: Yes  Other Topics Concern   Not on file  Social History Narrative   ** Merged History Encounter **       Social Determinants of Health   Financial Resource Strain: Not on file  Food Insecurity: Not on file  Transportation Needs: Not on file  Physical Activity: Not on file  Stress: Not on file  Social Connections: Not on file  Intimate Partner Violence: Not on file    Past Surgical History:  Procedure Laterality Date   COLONOSCOPY  08/05/2008   Colon polyp sttus post polypectomy. Small internal hemorrhoids (most likely etiology of bright red blood per rectum) Rare diverticula in the sigmoid colon.    COLONOSCOPY   08/10/2015   Dr. Noe Gens. [facility], Internal hemorrhoids are noted, inadequate prep, limeted view , Repeat in 5 years   ESOPHAGOGASTRODUODENOSCOPY  06/25/2019   Dr.Shabena Shahid, [facility]. Irregular Z-line, biopsied, Gastritis, biopisied, hiatal hernia   HERNIA REPAIR Bilateral    inguinal yrs agp per pt on 01-11-2021    Family History  Problem Relation Age of Onset   Diabetes Mother    Irritable bowel syndrome Mother    Cirrhosis Mother    Prostate cancer Father    Diabetes Father    Kidney disease Father    Diabetes Sister    Cirrhosis Sister    Pancreatic cancer Neg Hx    Colon cancer Neg Hx    Stomach cancer Neg Hx    Esophageal cancer Neg Hx     Allergies  Allergen Reactions   Vicks Nyquil Cough [Doxylamine-Dm]     insomnia    Current Outpatient Medications on File Prior to Visit  Medication Sig Dispense Refill   Albuterol Sulfate (PROAIR RESPICLICK) 108 (90 Base) MCG/ACT AEPB Inhale 2 puffs into the lungs as needed (wheezing or shortness of breath).     allopurinol (ZYLOPRIM) 300 MG tablet Take 300 mg by mouth daily.     amLODipine (NORVASC) 5 MG tablet Take 1 tablet (5 mg total) by mouth daily. (Patient not taking: Reported on 05/30/2022) 90 tablet 3   aspirin 81 MG chewable tablet Chew 81 mg by mouth daily.     Continuous Blood Gluc Sensor (FREESTYLE LIBRE 14 DAY SENSOR) MISC Change every 14 days 3 each 3   Continuous Blood Gluc Sensor (FREESTYLE LIBRE 2 SENSOR) MISC PLACE 1 DEVICE BY DOES NOT APPLY ROUTE EVERY 14 DAYS 2 each 2   empagliflozin (JARDIANCE) 25 MG TABS tablet Take 1 tablet (25 mg total) by mouth daily before breakfast. 90 tablet 3   famotidine (PEPCID) 20 MG tablet Take 1 tablet by mouth once daily 30 tablet 0   fenofibrate 160 MG tablet Take 1 tablet (160 mg total) by mouth daily. 90 tablet 3   finasteride (PROSCAR) 5 MG tablet Take 5 mg by mouth daily. (Patient not taking: Reported on 05/30/2022)     gabapentin (NEURONTIN)  100 MG capsule TAKE 1 CAPSULE BY MOUTH IN MORNING, AND TAKE 2 CAPSULES BY MOUTH IN EVENING 90 capsule 3   glipiZIDE (GLUCOTROL) 5 MG tablet Take 1 tablet (5 mg total) by mouth 2 (two) times daily before a meal. 180 tablet 3   HYDROcodone-acetaminophen (NORCO) 5-325 MG tablet Take 1 tablet by mouth every 6 (six) hours as needed for moderate pain. 16 tablet 0   hydrocortisone (PROCTOZONE-HC) 2.5 % rectal cream Place 1 Application rectally 2 (two) times daily. 30 g 1   lisinopril (ZESTRIL) 20 MG tablet Take  1 tablet (20 mg total) by mouth daily. 90 tablet 3   Omega-3 Fatty Acids (FISH OIL) 1000 MG CAPS Take 2 capsules (2,000 mg total) by mouth 2 (two) times daily. 180 capsule 12   omeprazole (PRILOSEC) 40 MG capsule Take 1 capsule (40 mg total) by mouth daily as needed. 90 capsule 3   ondansetron (ZOFRAN ODT) 4 MG disintegrating tablet Take 1 tablet (4 mg total) by mouth every 8 (eight) hours as needed for nausea or vomiting. 20 tablet 0   Pitavastatin Calcium (LIVALO) 1 MG TABS Take 1 tablet (1 mg total) by mouth daily. 30 tablet 12   sildenafil (VIAGRA) 50 MG tablet TAKE 1 TABLET BY MOUTH ONCE DAILY AS NEEDED FOR ERECTILE DYSFUNCTION 10 tablet 0   SUMAtriptan (IMITREX) 20 MG/ACT nasal spray MAY REPEAT IN 2 HOURS IF HEADACHE PERSISTS OR RECURS 6 each 0   tamsulosin (FLOMAX) 0.4 MG CAPS capsule TAKE 1 CAPSULE BY MOUTH AT BEDTIME *OVERDUE  FOR  VISIT* 15 capsule 0   tamsulosin (FLOMAX) 0.4 MG CAPS capsule Take 1 capsule (0.4 mg total) by mouth daily. 30 capsule 3   traZODone (DESYREL) 50 MG tablet Take 0.5-1 tablets (25-50 mg total) by mouth at bedtime as needed for sleep. 30 tablet 3   No current facility-administered medications on file prior to visit.    BP 130/80   Pulse 80   Temp 98.2 F (36.8 C)   Resp 18   Ht 5\' 7"  (1.702 m)   Wt 211 lb (95.7 kg)   SpO2 95%   BMI 33.05 kg/m        Objective:   Physical Exam   General Mental Status- Alert. General Appearance- Not in acute  distress.   Skin General: Color- Normal Color. Moisture- Normal Moisture.  Neck Carotid Arteries- Normal color. Moisture- Normal Moisture. No carotid bruits. No JVD.  Chest and Lung Exam Auscultation: Breath Sounds:-Normal.  Cardiovascular Auscultation:Rythm- Regular. Murmurs & Other Heart Sounds:Auscultation of the heart reveals- No Murmurs.  Abdomen Inspection:-Inspeection Normal. Palpation/Percussion:Note:No mass. Palpation and Percussion of the abdomen reveal- Non Tender, Non Distended + BS, no rebound or guarding.   Neurologic Cranial Nerve exam:- CN III-XII intact(No nystagmus), symmetric smile. Strength:- 5/5 equal and symmetric strength both upper and lower extremities.    Heent- no frontal or sinus pressure. Both canals and tm are clear. Septum is deviated toward the left.     Assessment & Plan:   Patient Instructions  1. Arthralgia of right hand. Pending test results can use tylenol over the counter with ibuprofen. Will do lab test and then advise on treatment and possible referral to Rheumatologist. Considered short medrol taper but recently sugars over 200 after eating. - Rheumatoid factor - ANA - C-reactive protein - Sedimentation rate - Uric acid - DG Hand Complete Right; Future  2. Pain in both hands Same plan as above.  3. Sense of smell altered Cause unknown presently. Considering possible sinus infection. Rx azithromycin antibiotic and flonase. Will see if smell returns to normal if not refer to ENT. On review considered covid but 7 days symptoms but no associated covid like symptoms.  Follow up in date to be determined after lab review.   Esperanza Richters, PA-C

## 2022-08-06 NOTE — Patient Instructions (Addendum)
1. Arthralgia of right hand. Pending test results can use tylenol over the counter with ibuprofen. Will do lab test and then advise on treatment and possible referral to Rheumatologist. Considered short medrol taper but recently sugars over 200 after eating. - Rheumatoid factor - ANA - C-reactive protein - Sedimentation rate - Uric acid - DG Hand Complete Right; Future  2. Pain in both hands Same plan as above.  3. Sense of smell altered Cause unknown presently. Considering possible sinus infection. Rx azithromycin antibiotic and flonase. Will see if smell returns to normal if not refer to ENT. On review considered covid but 7 days symptoms but no associated covid like symptoms.  Follow up in date to be determined after lab review.

## 2022-08-07 LAB — ANA: Anti Nuclear Antibody (ANA): NEGATIVE

## 2022-08-07 LAB — RHEUMATOID FACTOR: Rheumatoid fact SerPl-aCnc: <10 IU/mL (ref <14)

## 2022-08-07 NOTE — Addendum Note (Signed)
Addended by: Gwenevere Abbot on: 08/07/2022 10:45 AM   Modules accepted: Orders

## 2022-08-12 ENCOUNTER — Ambulatory Visit: Payer: 59 | Admitting: Medical

## 2022-08-25 ENCOUNTER — Other Ambulatory Visit: Payer: Self-pay | Admitting: Internal Medicine

## 2022-09-08 ENCOUNTER — Other Ambulatory Visit: Payer: Self-pay | Admitting: Medical

## 2022-09-16 ENCOUNTER — Other Ambulatory Visit: Payer: Self-pay | Admitting: Medical

## 2022-09-20 ENCOUNTER — Encounter: Payer: Self-pay | Admitting: Cardiology

## 2022-09-20 DIAGNOSIS — E782 Mixed hyperlipidemia: Secondary | ICD-10-CM

## 2022-09-23 MED ORDER — EZETIMIBE 10 MG PO TABS: 10.0000 mg | ORAL_TABLET | Freq: Every day | ORAL | 3 refills | Status: DC

## 2022-09-26 ENCOUNTER — Encounter: Payer: Self-pay | Admitting: Internal Medicine

## 2022-09-26 DIAGNOSIS — E1149 Type 2 diabetes mellitus with other diabetic neurological complication: Secondary | ICD-10-CM

## 2022-09-28 ENCOUNTER — Other Ambulatory Visit: Payer: Self-pay | Admitting: Medical

## 2022-09-30 MED ORDER — FREESTYLE LIBRE 14 DAY SENSOR MISC: 3 refills | Status: DC

## 2022-10-02 MED ORDER — DEXCOM G6 TRANSMITTER MISC: 1.0000 | 3 refills | Status: AC

## 2022-10-02 MED ORDER — DEXCOM G6 SENSOR MISC
1 refills | Status: DC
Start: 2022-10-02 — End: 2022-12-13

## 2022-10-02 MED ORDER — DEXCOM G6 RECEIVER DEVI
0 refills | Status: DC
Start: 2022-10-02 — End: 2023-03-21

## 2022-10-02 NOTE — Addendum Note (Signed)
Addended by: Pollie Meyer on: 10/02/2022 02:16 PM   Modules accepted: Orders

## 2022-10-07 ENCOUNTER — Encounter: Payer: Self-pay | Admitting: Medical

## 2022-10-07 ENCOUNTER — Ambulatory Visit: Payer: 59 | Admitting: Medical

## 2022-10-07 VITALS — BP 115/77 | HR 81 | Temp 97.6°F | Resp 16 | Ht 67.0 in | Wt 211.2 lb

## 2022-10-07 DIAGNOSIS — M25571 Pain in right ankle and joints of right foot: Secondary | ICD-10-CM | POA: Diagnosis not present

## 2022-10-07 DIAGNOSIS — M25579 Pain in unspecified ankle and joints of unspecified foot: Secondary | ICD-10-CM

## 2022-10-07 DIAGNOSIS — M7661 Achilles tendinitis, right leg: Secondary | ICD-10-CM | POA: Diagnosis not present

## 2022-10-07 DIAGNOSIS — E1169 Type 2 diabetes mellitus with other specified complication: Secondary | ICD-10-CM

## 2022-10-07 DIAGNOSIS — E785 Hyperlipidemia, unspecified: Secondary | ICD-10-CM | POA: Diagnosis not present

## 2022-10-07 LAB — CBC WITH DIFFERENTIAL/PLATELET
Basophils Absolute: 0 10*3/uL (ref 0.0–0.1)
Basophils Relative: 0.5 % (ref 0.0–3.0)
Eosinophils Absolute: 0.2 10*3/uL (ref 0.0–0.7)
Eosinophils Relative: 3.7 % (ref 0.0–5.0)
HCT: 46.3 % (ref 39.0–52.0)
Hemoglobin: 15 g/dL (ref 13.0–17.0)
Lymphocytes Relative: 34.2 % (ref 12.0–46.0)
Lymphs Abs: 1.7 10*3/uL (ref 0.7–4.0)
MCHC: 32.5 g/dL (ref 30.0–36.0)
MCV: 85 fl (ref 78.0–100.0)
Monocytes Absolute: 0.3 10*3/uL (ref 0.1–1.0)
Monocytes Relative: 6.9 % (ref 3.0–12.0)
Neutro Abs: 2.7 10*3/uL (ref 1.4–7.7)
Neutrophils Relative %: 54.7 % (ref 43.0–77.0)
Platelets: 176 10*3/uL (ref 150.0–400.0)
RBC: 5.45 Mil/uL (ref 4.22–5.81)
RDW: 14.5 % (ref 11.5–15.5)
WBC: 5 10*3/uL (ref 4.0–10.5)

## 2022-10-07 LAB — LIPID PANEL
Cholesterol: 190 mg/dL (ref 0–200)
HDL: 35.6 mg/dL — ABNORMAL LOW (ref >39.00)
NonHDL: 154.12
Total CHOL/HDL Ratio: 5
Triglycerides: 356 mg/dL — ABNORMAL HIGH (ref 0.0–149.0)
VLDL: 71.2 mg/dL — ABNORMAL HIGH (ref 0.0–40.0)

## 2022-10-07 LAB — COMPREHENSIVE METABOLIC PANEL
ALT: 31 U/L (ref 0–53)
AST: 23 U/L (ref 0–37)
Albumin: 4.8 g/dL (ref 3.5–5.2)
Alkaline Phosphatase: 35 U/L — ABNORMAL LOW (ref 39–117)
BUN: 28 mg/dL — ABNORMAL HIGH (ref 6–23)
CO2: 23 mEq/L (ref 19–32)
Calcium: 9.9 mg/dL (ref 8.4–10.5)
Chloride: 106 mEq/L (ref 96–112)
Creatinine, Ser: 1.43 mg/dL (ref 0.40–1.50)
GFR: 56.43 mL/min — ABNORMAL LOW (ref >60.00)
Glucose, Bld: 135 mg/dL — ABNORMAL HIGH (ref 70–99)
Potassium: 4.7 mEq/L (ref 3.5–5.1)
Sodium: 140 mEq/L (ref 135–145)
Total Bilirubin: 0.7 mg/dL (ref 0.2–1.2)
Total Protein: 7.6 g/dL (ref 6.0–8.3)

## 2022-10-07 LAB — URIC ACID: Uric Acid, Serum: 7.1 mg/dL (ref 4.0–7.8)

## 2022-10-07 LAB — LDL CHOLESTEROL, DIRECT: Direct LDL: 85 mg/dL

## 2022-10-07 MED ORDER — DICLOFENAC SODIUM 75 MG PO TBEC: 75.0000 mg | DELAYED_RELEASE_TABLET | Freq: Two times a day (BID) | ORAL | 0 refills | Status: DC

## 2022-10-07 NOTE — Progress Notes (Signed)
   Subjective:    Patient ID: Joshua Skinner, male    DOB: 1970-12-12, 52 y.o.   MRN: [id number]  HPI  /Discussed the use of AI scribe software for clinical note transcription with the patient, who gave verbal consent to proceed.  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 The discomfort, which began the previous day, is localized to the lateral ankle and Achilles tendon region. The patient denied any preceding trauma or injury to the area. The pain is exacerbated by walking, standing, and at rest, even when lying down. The patient noted that the ankle felt warm to the touch initially, but denied any systemic symptoms such as fevers, chills, or sweats. The patient's past gout attacks have typically affected the foot, but not the ankle. The patient's work primarily involves driving, with occasional unloading of the truck, although this has been limited in recent weeks due to the pain. The pain was severe enough to be noticeable upon waking and taking the first step of the day. 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        Review of Systems See hpi.    Objective:   Physical Exam  General- No acute distress. Pleasant patient. Neck- Full range of motion, no jvd Lungs- Clear, even and unlabored. Heart- regular rate and rhythm. Neurologic- CNII- XII grossly intact.  Rt ankle- mostly pain in achilles tendon on palpation. Calcaneus insertion site pain moderte to severe on palpation. Faint lateral ankle pain. No foot pain on palpation. No redness or warmth presently.   Rt calf- no pain or swelling.      Assessment & Plan:   Assessment and Plan    Right Ankle Pain: New onset, localized to the lateral ankle and Achilles tendon. No history of trauma. Pain at rest and with movement. Warmth noted by patient but not currently present. History of gout in the foot, but not  the ankle. -Start Diclofenac for anti-inflammatory effect. -Apply Ace wrap for compression and to limit range of motion. -If no significant improvement in one week, obtain right ankle x-ray (order already placed in system). -Order CBC to evaluate for elevated infection fighting cells and uric acid level to assess for gout.  Hyperlipidemia: Active management. -Order lipid panel and metabolic panel.  Follow-up: To be determined based on lab results and patient's pain status. Consider referral to sports medicine if pain persists.       Advised continue zetia.

## 2022-10-07 NOTE — Patient Instructions (Addendum)
Right Ankle Pain: New onset, localized to the lateral ankle and Achilles tendon. No history of trauma. Pain at rest and with movement. Warmth noted by patient but not currently present. History of gout in the foot, but not the ankle. -Start Diclofenac for anti-inflammatory effect. -Apply Ace wrap for compression and to limit range of motion. -If no significant improvement in one week, obtain right ankle x-ray (order already placed in system). -Order CBC to evaluate for elevated infection fighting cells and uric acid level to assess for gout.  Hyperlipidemia: Active management. -Order lipid panel and metabolic panel.  Follow-up: To be determined based on lab results and patient's pain status. Consider referral to sports medicine if pain persists.

## 2022-10-07 NOTE — Telephone Encounter (Signed)
I messaged pt back to see if he can be seen tomorrow, but with his foot being hot not sure if he should wait.  Can he wait you think or should we refer to urgent care?

## 2022-10-07 NOTE — Addendum Note (Signed)
Addended by: Gwenevere Abbot on: 10/07/2022 04:21 PM   Modules accepted: Orders

## 2022-10-07 NOTE — Telephone Encounter (Signed)
Patient was seen in office today.  

## 2022-10-09 ENCOUNTER — Encounter: Payer: Self-pay | Admitting: Medical

## 2022-10-12 ENCOUNTER — Other Ambulatory Visit: Payer: Self-pay | Admitting: Medical

## 2022-10-17 ENCOUNTER — Encounter: Payer: Self-pay | Admitting: Medical

## 2022-10-18 MED ORDER — ALLOPURINOL 300 MG PO TABS: 300.0000 mg | ORAL_TABLET | Freq: Every day | ORAL | 3 refills | Status: DC

## 2022-10-18 NOTE — Addendum Note (Signed)
Addended by: Gwenevere Abbot on: 10/18/2022 01:33 PM   Modules accepted: Orders

## 2022-10-21 ENCOUNTER — Telehealth: Payer: 59 | Admitting: Medical

## 2022-10-23 ENCOUNTER — Other Ambulatory Visit: Payer: Self-pay | Admitting: Internal Medicine

## 2022-10-28 ENCOUNTER — Other Ambulatory Visit: Payer: Self-pay | Admitting: Medical

## 2022-10-31 ENCOUNTER — Other Ambulatory Visit: Payer: Self-pay | Admitting: Medical

## 2022-11-01 MED ORDER — DICLOFENAC SODIUM 75 MG PO TBEC: 75.0000 mg | DELAYED_RELEASE_TABLET | Freq: Two times a day (BID) | ORAL | 0 refills | Status: DC

## 2022-11-06 ENCOUNTER — Encounter: Payer: Self-pay | Admitting: Medical

## 2022-11-06 NOTE — Addendum Note (Signed)
Addended by: Gwenevere Abbot on: 11/06/2022 01:37 PM   Modules accepted: Orders

## 2022-11-11 ENCOUNTER — Ambulatory Visit (HOSPITAL_BASED_OUTPATIENT_CLINIC_OR_DEPARTMENT_OTHER)
Admission: RE | Admit: 2022-11-11 | Discharge: 2022-11-11 | Disposition: A | Discharge status: Home / Self Care | Payer: 59 | Source: Ambulatory Visit | Attending: Medical | Admitting: Medical

## 2022-11-11 DIAGNOSIS — M25579 Pain in unspecified ankle and joints of unspecified foot: Secondary | ICD-10-CM | POA: Diagnosis not present

## 2022-11-11 DIAGNOSIS — M25571 Pain in right ankle and joints of right foot: Secondary | ICD-10-CM | POA: Diagnosis not present

## 2022-11-17 ENCOUNTER — Other Ambulatory Visit: Payer: Self-pay | Admitting: Medical

## 2022-11-17 ENCOUNTER — Other Ambulatory Visit: Payer: Self-pay | Admitting: Internal Medicine

## 2022-11-21 ENCOUNTER — Other Ambulatory Visit: Payer: Self-pay | Admitting: Medical

## 2022-12-01 ENCOUNTER — Other Ambulatory Visit: Payer: Self-pay | Admitting: Medical

## 2022-12-06 ENCOUNTER — Encounter: Payer: Self-pay | Admitting: Medical

## 2022-12-08 ENCOUNTER — Other Ambulatory Visit: Payer: Self-pay | Admitting: Medical

## 2022-12-09 NOTE — Addendum Note (Signed)
Addended by: Gwenevere Abbot on: 12/09/2022 08:07 PM   Modules accepted: Orders

## 2022-12-13 ENCOUNTER — Other Ambulatory Visit: Payer: Self-pay | Admitting: Internal Medicine

## 2022-12-13 DIAGNOSIS — E1149 Type 2 diabetes mellitus with other diabetic neurological complication: Secondary | ICD-10-CM

## 2022-12-20 ENCOUNTER — Ambulatory Visit: Payer: 59 | Admitting: Podiatry

## 2022-12-20 ENCOUNTER — Ambulatory Visit (INDEPENDENT_AMBULATORY_CARE_PROVIDER_SITE_OTHER): Payer: 59

## 2022-12-20 DIAGNOSIS — M7662 Achilles tendinitis, left leg: Secondary | ICD-10-CM | POA: Diagnosis not present

## 2022-12-20 DIAGNOSIS — M779 Enthesopathy, unspecified: Secondary | ICD-10-CM

## 2022-12-20 DIAGNOSIS — M7661 Achilles tendinitis, right leg: Secondary | ICD-10-CM

## 2022-12-20 DIAGNOSIS — M9261 Juvenile osteochondrosis of tarsus, right ankle: Secondary | ICD-10-CM

## 2022-12-20 MED ORDER — MELOXICAM 15 MG PO TABS: 15.0000 mg | ORAL_TABLET | Freq: Every day | ORAL | 0 refills | Status: DC

## 2022-12-20 MED ORDER — METHYLPREDNISOLONE 4 MG PO TBPK: ORAL_TABLET | ORAL | 0 refills | Status: AC

## 2022-12-20 NOTE — Patient Instructions (Signed)
Achilles Tendinitis  with Rehab Achilles tendinitis is a disorder of the Achilles tendon. The Achilles tendon connects the large calf muscles (Gastrocnemius and Soleus) to the heel bone (calcaneus). This tendon is sometimes called the heel cord. It is important for pushing-off and standing on your toes and is important for walking, running, or jumping. Tendinitis is often caused by overuse and repetitive microtrauma. SYMPTOMS  Pain, tenderness, swelling, warmth, and redness may occur over the Achilles tendon even at rest.  Pain with pushing off, or flexing or extending the ankle.  Pain that is worsened after or during activity. CAUSES   Overuse sometimes seen with rapid increase in exercise programs or in sports requiring running and jumping.  Poor physical conditioning (strength and flexibility or endurance).  Running sports, especially training running down hills.  Inadequate warm-up before practice or play or failure to stretch before participation.  Injury to the tendon. PREVENTION   Warm up and stretch before practice or competition.  Allow time for adequate rest and recovery between practices and competition.  Keep up conditioning.  Keep up ankle and leg flexibility.  Improve or keep muscle strength and endurance.  Improve cardiovascular fitness.  Use proper technique.  Use proper equipment (shoes, skates).  To help prevent recurrence, taping, protective strapping, or an adhesive bandage may be recommended for several weeks after healing is complete. PROGNOSIS   Recovery may take weeks to several months to heal.  Longer recovery is expected if symptoms have been prolonged.  Recovery is usually quicker if the inflammation is due to a direct blow as compared with overuse or sudden strain. RELATED COMPLICATIONS   Healing time will be prolonged if the condition is not correctly treated. The injury must be given plenty of time to heal.  Symptoms can reoccur if  activity is resumed too soon.  Untreated, tendinitis may increase the risk of tendon rupture requiring additional time for recovery and possibly surgery. TREATMENT   The first treatment consists of rest anti-inflammatory medication, and ice to relieve the pain.  Stretching and strengthening exercises after resolution of pain will likely help reduce the risk of recurrence. Referral to a physical therapist or athletic trainer for further evaluation and treatment may be helpful.  A walking boot or cast may be recommended to rest the Achilles tendon. This can help break the cycle of inflammation and microtrauma.  Arch supports (orthotics) may be prescribed or recommended by your caregiver as an adjunct to therapy and rest.  Surgery to remove the inflamed tendon lining or degenerated tendon tissue is rarely necessary and has shown less than predictable results. MEDICATION   Nonsteroidal anti-inflammatory medications, such as aspirin and ibuprofen, may be used for pain and inflammation relief. Do not take within 7 days before surgery. Take these as directed by your caregiver. Contact your caregiver immediately if any bleeding, stomach upset, or signs of allergic reaction occur. Other minor pain relievers, such as acetaminophen, may also be used.  Pain relievers may be prescribed as necessary by your caregiver. Do not take prescription pain medication for longer than 4 to 7 days. Use only as directed and only as much as you need. HEAT AND COLD  Cold is used to relieve pain and reduce inflammation for acute and chronic Achilles tendinitis. Cold should be applied for 10 to 15 minutes every 2 to 3 hours for inflammation and pain and immediately after any activity that aggravates your symptoms. Use ice packs or an ice massage.  Heat may be used  before performing stretching and strengthening activities prescribed by your caregiver. Use a heat pack or a warm soak. SEEK MEDICAL CARE IF:  Symptoms get  worse or do not improve in 2 weeks despite treatment.  New, unexplained symptoms develop. Drugs used in treatment may produce side effects.   EXERCISES-- hold each stretch for 30 seconds and repeat 10 times.  Complete each stretch 3 times per day.   RANGE OF MOTION (ROM) AND STRETCHING EXERCISES - Achilles Tendinitis  These exercises may help you when beginning to rehabilitate your injury. Your symptoms may resolve with or without further involvement from your physician, physical therapist or athletic trainer. While completing these exercises, remember:   Restoring tissue flexibility helps normal motion to return to the joints. This allows healthier, less painful movement and activity.  An effective stretch should be held for at least 30 seconds.  A stretch should never be painful. You should only feel a gentle lengthening or release in the stretched tissue. STRETCH  Gastroc, Standing   Place hands on wall.  Extend right / left leg, keeping the front knee somewhat bent.  Slightly point your toes inward on your back foot.  Keeping your right / left heel on the floor and your knee straight, shift your weight toward the wall, not allowing your back to arch.  You should feel a gentle stretch in the right / left calf. Hold this position for __________ seconds. Repeat __________ times. Complete this stretch __________ times per day. STRETCH  Soleus, Standing   Place hands on wall.  Extend right / left leg, keeping the other knee somewhat bent.  Slightly point your toes inward on your back foot.  Keep your right / left heel on the floor, bend your back knee, and slightly shift your weight over the back leg so that you feel a gentle stretch deep in your back calf.  Hold this position for __________ seconds. Repeat __________ times. Complete this stretch __________ times per day. STRETCH  Gastrocsoleus, Standing  Note: This exercise can place a lot of stress on your foot and ankle.  Please complete this exercise only if specifically instructed by your caregiver.   Place the ball of your right / left foot on a step, keeping your other foot firmly on the same step.  Hold on to the wall or a rail for balance.  Slowly lift your other foot, allowing your body weight to press your heel down over the edge of the step.  You should feel a stretch in your right / left calf.  Hold this position for __________ seconds.  Repeat this exercise with a slight bend in your knee. Repeat __________ times. Complete this stretch __________ times per day.  STRENGTHENING EXERCISES - Achilles Tendinitis These exercises may help you when beginning to rehabilitate your injury. They may resolve your symptoms with or without further involvement from your physician, physical therapist or athletic trainer. While completing these exercises, remember:   Muscles can gain both the endurance and the strength needed for everyday activities through controlled exercises.  Complete these exercises as instructed by your physician, physical therapist or athletic trainer. Progress the resistance and repetitions only as guided.  You may experience muscle soreness or fatigue, but the pain or discomfort you are trying to eliminate should never worsen during these exercises. If this pain does worsen, stop and make certain you are following the directions exactly. If the pain is still present after adjustments, discontinue the exercise until you can discuss  the trouble with your clinician. STRENGTH - Plantar-flexors   Sit with your right / left leg extended. Holding onto both ends of a rubber exercise band/tubing, loop it around the ball of your foot. Keep a slight tension in the band.  Slowly push your toes away from you, pointing them downward.  Hold this position for __________ seconds. Return slowly, controlling the tension in the band/tubing. Repeat __________ times. Complete this exercise __________ times  per day.  STRENGTH - Plantar-flexors   Stand with your feet shoulder width apart. Steady yourself with a wall or table using as little support as needed.  Keeping your weight evenly spread over the width of your feet, rise up on your toes.*  Hold this position for __________ seconds. Repeat __________ times. Complete this exercise __________ times per day.  *If this is too easy, shift your weight toward your right / left leg until you feel challenged. Ultimately, you may be asked to do this exercise with your right / left foot only. STRENGTH  Plantar-flexors, Eccentric  Note: This exercise can place a lot of stress on your foot and ankle. Please complete this exercise only if specifically instructed by your caregiver.   Place the balls of your feet on a step. With your hands, use only enough support from a wall or rail to keep your balance.  Keep your knees straight and rise up on your toes.  Slowly shift your weight entirely to your right / left toes and pick up your opposite foot. Gently and with controlled movement, lower your weight through your right / left foot so that your heel drops below the level of the step. You will feel a slight stretch in the back of your calf at the end position.  Use the healthy leg to help rise up onto the balls of both feet, then lower weight only on the right / left leg again. Build up to 15 repetitions. Then progress to 3 consecutive sets of 15 repetitions.*  After completing the above exercise, complete the same exercise with a slight knee bend (about 30 degrees). Again, build up to 15 repetitions. Then progress to 3 consecutive sets of 15 repetitions.* Perform this exercise __________ times per day.  *When you easily complete 3 sets of 15, your physician, physical therapist or athletic trainer may advise you to add resistance by wearing a backpack filled with additional weight. STRENGTH - Plantar Flexors, Seated   Sit on a chair that allows your feet  to rest flat on the ground. If necessary, sit at the edge of the chair.  Keeping your toes firmly on the ground, lift your right / left heel as far as you can without increasing any discomfort in your ankle. Repeat __________ times. Complete this exercise __________ times a day. *If instructed by your physician, physical therapist or athletic trainer, you may add ____________________ of resistance by placing a weighted object on your right / left knee. Document Released: 10/17/2004 Document Revised: 06/10/2011 Document Reviewed: 06/30/2008 [facility] Patient Information 2014 [place], [place].

## 2022-12-20 NOTE — Progress Notes (Signed)
Subjective:  Patient ID: Joshua Skinner, male    DOB: 1971-02-19,  MRN: [id number]  Chief Complaint  Patient presents with   Foot Pain    C/o pain to the achilles tendon. Pain is worst when he is flexing his foot upwards. He is currently taking Diclofenac and it is helping with the pain.     52 y.o. male presents with concern for pain in the bilateral Achilles right much worse than left.  Recently noted that after he did some stretching on the side of his truck he had a lot of pain in the back of his right heel.  Pain is increased after sleeping or sitting for a while especially while he is driving.  Patient does have a history of diabetes.  He is currently taking diclofenac within only helps while he is taking and afterwards comes right back. he modified his shoes to the open back shoes to prevent pressure on the back of his heel.  Past Medical History:  Diagnosis Date   Acute idiopathic gout of left elbow 03/09/2018   AKI (acute kidney injury) (HCC) 02/23/2021   Anxiety    Arthritis    Asthma    COVID-19 05/11/2020   Diabetes mellitus (HCC) 01/29/2021   Enterocolitis 07/10/2020   GERD (gastroesophageal reflux disease)    Gout    last flare up 2020 per pt   Hemorrhoids    History of COVID-19 03/22/2019   fever, body aches chills x 3 weeks monoclonal iv antibodies given all symptoms resolved   History of COVID-19 07/2019   mild symtpoms all symptosm resolved   Hyperlipidemia    Hypertension    Kidney stones 04/29/2017   left ureteral stone    Need for immunization against influenza 01/29/2021   Obesity (BMI 30-39.9) 07/13/2020   Olecranon bursitis of left elbow 05/17/2014   Pancreatitis 07/10/2020   Right ureteral stone 11/27/2017   Synovitis of elbow 01/23/2018   Type 2 diabetes mellitus with hyperglycemia, without long-term current use of insulin (HCC) 01/29/2021   Type 2 DM     Allergies  Allergen Reactions   Vicks Nyquil Cough [Doxylamine-Dm]     insomnia    ROS:  Negative except as per HPI above  Objective:  General: AAO x3, NAD  Dermatological: With inspection and palpation of the right and left lower extremities there are no open sores, no preulcerative lesions, no rash or signs of infection present. Nails are of normal length thickness and coloration.   Vascular:  Dorsalis Pedis artery and Posterior Tibial artery pedal pulses are 2/4 bilateral.  Capillary fill time < 3 sec to all digits.   Neruologic: Grossly intact via light touch bilateral. Protective threshold intact to all sites bilateral.   Musculoskeletal: Pain on palpation of the Achilles tendon just proximal to its insertion point on the bilateral heel right look much worse than left.   moderate pain with palpation at the insertion point of the Achilles below without significant osseous prominence though there is a mild bump there.  Decreased ankle dorsiflexion range of motion and this increases his pain  Gait: Unassisted, Nonantalgic.   No images are attached to the encounter.  Radiographs:  Date: 12/20/2022 XR the right foot Weightbearing AP/Lateral/Oblique   Findings: Small osseous spur at the posterior aspect calcaneus at the insertion point of the Achilles tendon as well as Haglund's deformity is noted on the right heel.  No intratendinous calcification noted. Assessment:   1. Achilles tendinitis of both lower extremities  2. Haglund's deformity of right heel      Plan:  Patient was evaluated and treated and all questions answered.  # Achilles tendinitis bilateral extremity right much worse than left # Haglund deformity right heel -Recommend conservative therapy including stretching icing anti-inflammatories -eRx for methylprednisone 4 mg steroid taper pack take as directed for 6 days.  Discussed that as he is diabetic he does have risk of elevated blood glucose levels with steroid -eRx for meloxicam 15 mg take once daily for the next 30 days -Encourage stretching exercises  and provided instructions in the paperwork -Discussed if no improvement will consider physical therapy and if that fails as well would consider surgical intervention if needed   Return in about 4 weeks (around 01/17/2023) for f/u R ahcilles tendinitis.          Corinna Gab, DPM [facility] / [facility]

## 2022-12-30 ENCOUNTER — Other Ambulatory Visit: Payer: Self-pay | Admitting: Podiatry

## 2022-12-30 DIAGNOSIS — M9261 Juvenile osteochondrosis of tarsus, right ankle: Secondary | ICD-10-CM

## 2022-12-30 DIAGNOSIS — M7661 Achilles tendinitis, right leg: Secondary | ICD-10-CM

## 2022-12-30 DIAGNOSIS — M779 Enthesopathy, unspecified: Secondary | ICD-10-CM

## 2023-01-11 ENCOUNTER — Other Ambulatory Visit: Payer: Self-pay | Admitting: Medical

## 2023-01-17 ENCOUNTER — Ambulatory Visit: Payer: 59 | Admitting: Podiatry

## 2023-01-26 ENCOUNTER — Other Ambulatory Visit: Payer: Self-pay | Admitting: Podiatry

## 2023-01-31 ENCOUNTER — Ambulatory Visit: Payer: 59 | Admitting: Podiatry

## 2023-01-31 ENCOUNTER — Encounter: Payer: Self-pay | Admitting: Podiatry

## 2023-01-31 DIAGNOSIS — M7661 Achilles tendinitis, right leg: Secondary | ICD-10-CM

## 2023-01-31 NOTE — Progress Notes (Signed)
Subjective:  Patient ID: Joshua Skinner, male    DOB: [birth year],  MRN: [id number]  Chief Complaint  Patient presents with   Foot Pain    R ahcilles tendinitis follow up is doing better    52 y.o. male presents with the above complaint.  Patient presents with bilateral Achilles tendinitis right greater than left side.  He states is still causing some pain is doing a little bit better.  He wanted get it evaluated has not been immobilized.  Pain scale is 5 out of 10 dull aching nature   Review of Systems: Negative except as noted in the HPI. Denies N/V/F/Ch.  Past Medical History:  Diagnosis Date   Acute idiopathic gout of left elbow 03/09/2018   AKI (acute kidney injury) (HCC) 02/23/2021   Anxiety    Arthritis    Asthma    COVID-19 05/11/2020   Diabetes mellitus (HCC) 01/29/2021   Enterocolitis 07/10/2020   GERD (gastroesophageal reflux disease)    Gout    last flare up 2020 per pt   Hemorrhoids    History of COVID-19 03/22/2019   fever, body aches chills x 3 weeks monoclonal iv antibodies given all symptoms resolved   History of COVID-19 07/2019   mild symtpoms all symptosm resolved   Hyperlipidemia    Hypertension    Kidney stones 04/29/2017   left ureteral stone    Need for immunization against influenza 01/29/2021   Obesity (BMI 30-39.9) 07/13/2020   Olecranon bursitis of left elbow 05/17/2014   Pancreatitis 07/10/2020   Right ureteral stone 11/27/2017   Synovitis of elbow 01/23/2018   Type 2 diabetes mellitus with hyperglycemia, without long-term current use of insulin (HCC) 01/29/2021   Type 2 DM     Current Outpatient Medications:    Albuterol Sulfate (PROAIR RESPICLICK) 108 (90 Base) MCG/ACT AEPB, Inhale 2 puffs into the lungs as needed (wheezing or shortness of breath)., Disp: , Rfl:    allopurinol (ZYLOPRIM) 300 MG tablet, Take 1 tablet (300 mg total) by mouth daily., Disp: 90 tablet, Rfl: 3   aspirin 81 MG chewable tablet, Chew 81 mg by mouth daily., Disp: ,  Rfl:    Continuous Glucose Receiver (DEXCOM G6 RECEIVER) DEVI, Use to monitor blood glucose continuously, Disp: 1 each, Rfl: 0   Continuous Glucose Sensor (DEXCOM G6 SENSOR) MISC, USE AS DIRECTED FOR CONTINUOUS MONITORING, Disp: 3 each, Rfl: 3   Continuous Glucose Transmitter (DEXCOM G6 TRANSMITTER) MISC, 1 Device by Does not apply route continuous., Disp: 1 each, Rfl: 3   diclofenac (VOLTAREN) 75 MG EC tablet, Take 1 tablet by mouth twice daily, Disp: 20 tablet, Rfl: 0   empagliflozin (JARDIANCE) 25 MG TABS tablet, Take 1 tablet (25 mg total) by mouth daily before breakfast., Disp: 90 tablet, Rfl: 3   ezetimibe (ZETIA) 10 MG tablet, Take 1 tablet (10 mg total) by mouth daily., Disp: 90 tablet, Rfl: 3   finasteride (PROSCAR) 5 MG tablet, Take 5 mg by mouth daily., Disp: , Rfl:    gabapentin (NEURONTIN) 100 MG capsule, TAKE 1 CAPSULE BY MOUTH IN THE MORNING AND 2 CAPSULES IN THE EVENING, Disp: 90 capsule, Rfl: 0   glipiZIDE (GLUCOTROL) 5 MG tablet, TAKE 1 TABLET BY MOUTH TWICE DAILY BEFORE A MEAL, Disp: 60 tablet, Rfl: 3   HYDROcodone-acetaminophen (NORCO) 5-325 MG tablet, Take 1 tablet by mouth every 6 (six) hours as needed for moderate pain., Disp: 16 tablet, Rfl: 0   hydrocortisone (PROCTOZONE-HC) 2.5 % rectal cream, Place 1 Application rectally  2 (two) times daily., Disp: 30 g, Rfl: 1   meloxicam (MOBIC) 15 MG tablet, Take 1 tablet by mouth once daily, Disp: 30 tablet, Rfl: 0   methylPREDNISolone (MEDROL DOSEPAK) 4 MG TBPK tablet, Take as directed for 6 days, Disp: 1 each, Rfl: 0   Omega-3 Fatty Acids (FISH OIL) 1000 MG CAPS, Take 2 capsules (2,000 mg total) by mouth 2 (two) times daily., Disp: 180 capsule, Rfl: 12   omeprazole (PRILOSEC) 40 MG capsule, Take 1 capsule (40 mg total) by mouth daily as needed., Disp: 90 capsule, Rfl: 3   ondansetron (ZOFRAN ODT) 4 MG disintegrating tablet, Take 1 tablet (4 mg total) by mouth every 8 (eight) hours as needed for nausea or vomiting., Disp: 20 tablet,  Rfl: 0   sildenafil (VIAGRA) 50 MG tablet, TAKE 1 TABLET BY MOUTH ONCE DAILY AS NEEDED FOR ERECTILE DYSFUNCTION, Disp: 10 tablet, Rfl: 0   SUMAtriptan (IMITREX) 20 MG/ACT nasal spray, MAY REPEAT IN 2 HOURS IF HEADACHE PERSISTS OR RECURS, Disp: 6 each, Rfl: 0   tamsulosin (FLOMAX) 0.4 MG CAPS capsule, TAKE 1 CAPSULE BY MOUTH AT BEDTIME *OVERDUE  FOR  VISIT*, Disp: 15 capsule, Rfl: 0   tamsulosin (FLOMAX) 0.4 MG CAPS capsule, Take 1 capsule (0.4 mg total) by mouth daily., Disp: 30 capsule, Rfl: 3   traZODone (DESYREL) 50 MG tablet, Take 0.5-1 tablets (25-50 mg total) by mouth at bedtime as needed for sleep., Disp: 30 tablet, Rfl: 3   amLODipine (NORVASC) 5 MG tablet, Take 1 tablet by mouth once daily, Disp: 30 tablet, Rfl: 0   Continuous Glucose Sensor (FREESTYLE LIBRE 14 DAY SENSOR) MISC, Change every 14 days, Disp: 3 each, Rfl: 3   Continuous Glucose Sensor (FREESTYLE LIBRE 2 SENSOR) MISC, APPLY 1 SENSOR TO SKIN EVERY 14 DAYS. NEED OFFICE VISIT, Disp: 2 each, Rfl: 0   famotidine (PEPCID) 20 MG tablet, Take 1 tablet by mouth once daily, Disp: 30 tablet, Rfl: 0   fenofibrate 160 MG tablet, Take 1 tablet (160 mg total) by mouth daily., Disp: 90 tablet, Rfl: 3   fluticasone (FLONASE) 50 MCG/ACT nasal spray, Place 2 sprays into both nostrils daily., Disp: 16 g, Rfl: 1   lisinopril (ZESTRIL) 20 MG tablet, Take 1 tablet by mouth once daily, Disp: 30 tablet, Rfl: 0   Pitavastatin Calcium (LIVALO) 1 MG TABS, Take 1 tablet (1 mg total) by mouth daily., Disp: 30 tablet, Rfl: 12  Social History   Tobacco Use  Smoking Status Never  Smokeless Tobacco Never    Allergies  Allergen Reactions   Vicks Nyquil Cough [Doxylamine-Dm]     insomnia   Objective:  There were no vitals filed for this visit. There is no height or weight on file to calculate BMI. Constitutional Well developed. Well nourished.  Vascular Dorsalis pedis pulses palpable bilaterally. Posterior tibial pulses palpable  bilaterally. Capillary refill normal to all digits.  No cyanosis or clubbing noted. Pedal hair growth normal.  Neurologic Normal speech. Oriented to person, place, and time. Epicritic sensation to light touch grossly present bilaterally.  Dermatologic Nails well groomed and normal in appearance. No open wounds. No skin lesions.  Orthopedic: Pain on palpation to the right Achilles tendon.  Pain at the insertion positive Silfverskiold test noted gastrocnemius equinus Haglund's deformity clinically appreciated.   Radiographs: None Assessment:   1. Achilles tendinitis, right leg    Plan:  Patient was evaluated and treated and all questions answered.  Right Achilles tendinitis -All questions and concerns were discussed  with the patient in extensive detail given that he still has some residual pain benefit from cam boot immobilization Cam boot was dispensed. -He continues to regress we will discuss MRI possible physical therapy versus surgical intervention versus injection  No follow-ups on file.

## 2023-02-01 ENCOUNTER — Other Ambulatory Visit: Payer: Self-pay | Admitting: Medical

## 2023-02-16 ENCOUNTER — Other Ambulatory Visit: Payer: Self-pay | Admitting: Medical

## 2023-02-16 DIAGNOSIS — J020 Streptococcal pharyngitis: Secondary | ICD-10-CM | POA: Diagnosis not present

## 2023-02-16 DIAGNOSIS — Z681 Body mass index (BMI) 19 or less, adult: Secondary | ICD-10-CM | POA: Diagnosis not present

## 2023-03-07 ENCOUNTER — Ambulatory Visit: Payer: 59 | Admitting: Podiatry

## 2023-03-08 ENCOUNTER — Other Ambulatory Visit: Payer: Self-pay | Admitting: Medical

## 2023-03-08 ENCOUNTER — Other Ambulatory Visit: Payer: Self-pay | Admitting: Podiatry

## 2023-03-14 ENCOUNTER — Other Ambulatory Visit: Payer: Self-pay | Admitting: Medical

## 2023-03-21 ENCOUNTER — Encounter: Payer: Self-pay | Admitting: Internal Medicine

## 2023-03-21 ENCOUNTER — Ambulatory Visit: Payer: 59 | Admitting: Internal Medicine

## 2023-03-21 VITALS — BP 136/84 | HR 84 | Ht 67.0 in | Wt 212.0 lb

## 2023-03-21 DIAGNOSIS — Z7984 Long term (current) use of oral hypoglycemic drugs: Secondary | ICD-10-CM | POA: Diagnosis not present

## 2023-03-21 DIAGNOSIS — E1149 Type 2 diabetes mellitus with other diabetic neurological complication: Secondary | ICD-10-CM | POA: Diagnosis not present

## 2023-03-21 LAB — POCT GLYCOSYLATED HEMOGLOBIN (HGB A1C): Hemoglobin A1C: 7 % — AB (ref 4.0–5.6)

## 2023-03-21 MED ORDER — GLIPIZIDE 5 MG PO TABS: 5.0000 mg | ORAL_TABLET | Freq: Two times a day (BID) | ORAL | 3 refills | Status: DC

## 2023-03-21 MED ORDER — DEXCOM G7 SENSOR MISC: 1.0000 | 3 refills | Status: AC

## 2023-03-21 MED ORDER — EMPAGLIFLOZIN 25 MG PO TABS: 25.0000 mg | ORAL_TABLET | Freq: Every day | ORAL | 3 refills | Status: AC

## 2023-03-21 NOTE — Patient Instructions (Signed)
-   Continue  Glipizide 5 mg , 1 tablet before Breakfast  - Continue Jardiance 25 mg ,1 tablet  every morning     HOW TO TREAT LOW BLOOD SUGARS (Blood sugar LESS THAN 70 MG/DL) Please follow the RULE OF 15 for the treatment of hypoglycemia treatment (when your (blood sugars are less than 70 mg/dL)   STEP 1: Take 15 grams of carbohydrates when your blood sugar is low, which includes:  3-4 GLUCOSE TABS  OR 3-4 OZ OF JUICE OR REGULAR SODA OR ONE TUBE OF GLUCOSE GEL    STEP 2: RECHECK blood sugar in 15 MINUTES STEP 3: If your blood sugar is still low at the 15 minute recheck --> then, go back to STEP 1 and treat AGAIN with another 15 grams of carbohydrates.

## 2023-03-21 NOTE — Progress Notes (Signed)
Name: Bette Froman  MRN/ DOB: [id number], [birth year]   Age/ Sex: 52 y.o., male    PCP: Marisue [facility]   Reason for Endocrinology Evaluation: Type 2 Diabetes Mellitus     Date of Initial Endocrinology Visit: 08/14/2020    PATIENT IDENTIFIER: Mr. Wester Terrel is a 52 y.o. male with a past medical history of HTN, T2 DM, and dyslipidemia. The patient presented for initial endocrinology clinic visit on 08/14/2020 for consultative assistance with his diabetes management.    HPI: Mr. Smiling is accompanied by his spouse and daughter     DIABETIC HISTORY:  Mr. Dutkiewicz was diagnosed with DM in 2012, he has been on Glimepiride, OZempic/Trulicity - bad diarrhea . His hemoglobin A1c has ranged from 7.4% in 2022, peaking at 8.1% in 2021.   On his initial visit to our clinic he had an A1c of 7.4 % , he was on Metformin and we started [place]   Stopped Metformin due to GI symptoms and started Glipizide 12/2020  He is a truck driver  SUBJECTIVE:   During the last visit (10/15/2021): This was a virtual visit  Today (01/29/2021): Mr. Grasser is here for a follow up on diabetes management. He has NOT been to our clinic in 17 months.  He checks glucose multiple times daily through CGM.  He has been noted with any hypoglycemic episodes since his last visit to our clinic, he is symptomatic .   Patient was seen by podiatry 01/31/2023 Denies nausea or vomiting Denies constipation or diarrhea  Denies urine infection   HOME DIABETES REGIMEN: Glipizide 5 mg daily  Jardiance 25 mg daily    Statin: yes ACE-I/ARB: yes  Prior Diabetic Education: no    CONTINUOUS GLUCOSE MONITORING RECORD INTERPRETATION  Unable to download     DIABETIC COMPLICATIONS: Microvascular complications:   Denies: CKD , retinopathy, neuropathy  Last eye exam: Completed 2022  Macrovascular complications:   Denies: CAD, PVD, CVA   PAST HISTORY: Past Medical History:  Past Medical History:  Diagnosis Date    Acute idiopathic gout of left elbow 03/09/2018   AKI (acute kidney injury) (HCC) 02/23/2021   Anxiety    Arthritis    Asthma    COVID-19 05/11/2020   Diabetes mellitus (HCC) 01/29/2021   Enterocolitis 07/10/2020   GERD (gastroesophageal reflux disease)    Gout    last flare up 2020 per pt   Hemorrhoids    History of COVID-19 03/22/2019   fever, body aches chills x 3 weeks monoclonal iv antibodies given all symptoms resolved   History of COVID-19 07/2019   mild symtpoms all symptosm resolved   Hyperlipidemia    Hypertension    Kidney stones 04/29/2017   left ureteral stone    Need for immunization against influenza 01/29/2021   Obesity (BMI 30-39.9) 07/13/2020   Olecranon bursitis of left elbow 05/17/2014   Pancreatitis 07/10/2020   Right ureteral stone 11/27/2017   Synovitis of elbow 01/23/2018   Type 2 diabetes mellitus with hyperglycemia, without long-term current use of insulin (HCC) 01/29/2021   Type 2 DM    Past Surgical History:  Past Surgical History:  Procedure Laterality Date   COLONOSCOPY  08/05/2008   Colon polyp sttus post polypectomy. Small internal hemorrhoids (most likely etiology of bright red blood per rectum) Rare diverticula in the sigmoid colon.    COLONOSCOPY  08/10/2015   Dr. Noe Gens. [facility], Internal hemorrhoids are noted, inadequate prep, limeted view , Repeat in 5 years   ESOPHAGOGASTRODUODENOSCOPY  06/25/2019   Dr.Shabena Shahid, [facility]. Irregular Z-line, biopsied, Gastritis, biopisied, hiatal hernia   HERNIA REPAIR Bilateral    inguinal yrs agp per pt on 01-11-2021    Social History:  reports that he has never smoked. He has never used smokeless tobacco. He reports that he does not drink alcohol and does not use drugs. Family History:  Family History  Problem Relation Age of Onset   Diabetes Mother    Irritable bowel syndrome Mother    Cirrhosis Mother    Prostate cancer Father    Diabetes Father    Kidney  disease Father    Diabetes Sister    Cirrhosis Sister    Pancreatic cancer Neg Hx    Colon cancer Neg Hx    Stomach cancer Neg Hx    Esophageal cancer Neg Hx      HOME MEDICATIONS: Allergies as of 03/21/2023       Reactions   Vicks Nyquil Cough [doxylamine-dm]    insomnia        Medication List        Accurate as of March 21, 2023  2:26 PM. If you have any questions, ask your nurse or doctor.          STOP taking these medications    Dexcom G6 Receiver Devi Stopped by: Johnney Ou Martha Soltys       TAKE these medications    Albuterol Sulfate 108 (90 Base) MCG/ACT Aepb Commonly known as: PROAIR RESPICLICK Inhale 2 puffs into the lungs as needed (wheezing or shortness of breath).   allopurinol 300 MG tablet Commonly known as: ZYLOPRIM Take 1 tablet (300 mg total) by mouth daily.   amLODipine 5 MG tablet Commonly known as: NORVASC Take 1 tablet by mouth once daily   aspirin 81 MG chewable tablet Chew 81 mg by mouth daily.   Dexcom G6 Transmitter Misc 1 Device by Does not apply route continuous.   Dexcom G7 Sensor Misc 1 Device by Does not apply route as directed. What changed:  See the new instructions. Another medication with the same name was removed. Continue taking this medication, and follow the directions you see here. Changed by: Johnney Ou Marko Skalski   diclofenac 75 MG EC tablet Commonly known as: VOLTAREN Take 1 tablet by mouth twice daily   empagliflozin 25 MG Tabs tablet Commonly known as: Jardiance Take 1 tablet (25 mg total) by mouth daily before breakfast.   ezetimibe 10 MG tablet Commonly known as: ZETIA Take 1 tablet (10 mg total) by mouth daily.   famotidine 20 MG tablet Commonly known as: PEPCID Take 1 tablet by mouth once daily   fenofibrate 160 MG tablet Take 1 tablet (160 mg total) by mouth daily.   finasteride 5 MG tablet Commonly known as: PROSCAR Take 5 mg by mouth daily.   Fish Oil 1000 MG Caps Take 2  capsules (2,000 mg total) by mouth 2 (two) times daily.   fluticasone 50 MCG/ACT nasal spray Commonly known as: FLONASE Place 2 sprays into both nostrils daily.   gabapentin 100 MG capsule Commonly known as: NEURONTIN TAKE 1 CAPSULE BY MOUTH IN THE MORNING AND           2 CAPSULES IN THE EVENING   glipiZIDE 5 MG tablet Commonly known as: GLUCOTROL Take 1 tablet (5 mg total) by mouth 2 (two) times daily before a meal.   HYDROcodone-acetaminophen 5-325 MG tablet Commonly known as: Norco Take 1 tablet by mouth every 6 (six)  hours as needed for moderate pain.   lisinopril 20 MG tablet Commonly known as: ZESTRIL Take 1 tablet by mouth once daily   meloxicam 15 MG tablet Commonly known as: MOBIC Take 1 tablet by mouth once daily   methylPREDNISolone 4 MG Tbpk tablet Commonly known as: MEDROL DOSEPAK Take as directed for 6 days   omeprazole 40 MG capsule Commonly known as: PRILOSEC Take 1 capsule (40 mg total) by mouth daily as needed.   ondansetron 4 MG disintegrating tablet Commonly known as: Zofran ODT Take 1 tablet (4 mg total) by mouth every 8 (eight) hours as needed for nausea or vomiting.   Pitavastatin Calcium 1 MG Tabs Commonly known as: Livalo Take 1 tablet (1 mg total) by mouth daily.   Proctozone-HC 2.5 % rectal cream Generic drug: hydrocortisone Place 1 Application rectally 2 (two) times daily.   sildenafil 50 MG tablet Commonly known as: VIAGRA TAKE 1 TABLET BY MOUTH ONCE DAILY AS NEEDED FOR ERECTILE DYSFUNCTION   SUMAtriptan 20 MG/ACT nasal spray Commonly known as: IMITREX MAY REPEAT IN 2 HOURS IF HEADACHE PERSISTS OR RECURS   tamsulosin 0.4 MG Caps capsule Commonly known as: FLOMAX TAKE 1 CAPSULE BY MOUTH AT BEDTIME *OVERDUE  FOR  VISIT*   tamsulosin 0.4 MG Caps capsule Commonly known as: FLOMAX Take 1 capsule (0.4 mg total) by mouth daily.   traZODone 50 MG tablet Commonly known as: DESYREL Take 0.5-1 tablets (25-50 mg total) by mouth at  bedtime as needed for sleep.         ALLERGIES: Allergies  Allergen Reactions   Vicks Nyquil Cough [Doxylamine-Dm]     insomnia     REVIEW OF SYSTEMS: A comprehensive ROS was conducted with the patient and is negative except as per HPI \   OBJECTIVE:   VITAL SIGNS: BP 136/84 (BP Location: Left Arm, Patient Position: Sitting, Cuff Size: Normal)   Pulse 84   Ht 5\' 7"  (1.702 m)   Wt 212 lb (96.2 kg)   SpO2 95%   BMI 33.20 kg/m    PHYSICAL EXAM:  General: Pt appears well and is in NAD  Lungs: Clear with good BS bilat   Heart: RRR   Abdomen: soft, nontender  Extremities:  Lower extremities - No pretibial edema  Neuro: MS is good with appropriate affect, pt is alert and Ox3    DM foot exam: 01/31/2023 per podiatry  The skin of the feet is intact without sores or ulcerations. The pedal pulses are 1+ on right and 1+ on left. The sensation is decreased  to a screening 5.07, 10 gram monofilament B/L      DATA REVIEWED:  Lab Results  Component Value Date   HGBA1C 7.0 (A) 03/21/2023   HGBA1C 9.2 (H) 01/08/2021   HGBA1C 7.8 (A) 08/14/2020    Latest Reference Range & Units 10/07/22 12:19  Sodium 135 - 145 mEq/L 140  Potassium 3.5 - 5.1 mEq/L 4.7  Chloride 96 - 112 mEq/L 106  CO2 19 - 32 mEq/L 23  Glucose 70 - 99 mg/dL 409 (H)  BUN 6 - 23 mg/dL 28 (H)  Creatinine 8.11 - 1.50 mg/dL 9.14  Calcium 8.4 - 78.2 mg/dL 9.9  Alkaline Phosphatase 39 - 117 U/L 35 (L)  Albumin 3.5 - 5.2 g/dL 4.8  Uric Acid, Serum 4.0 - 7.8 mg/dL 7.1  AST 0 - 37 U/L 23  ALT 0 - 53 U/L 31  Total Protein 6.0 - 8.3 g/dL 7.6  Total Bilirubin 0.2 -  1.2 mg/dL 0.7  GFR >40.98 mL/min 56.43 (L)    Latest Reference Range & Units 10/07/22 12:19  Total CHOL/HDL Ratio  5  Cholesterol 0 - 200 mg/dL 119  HDL Cholesterol >14.78 mg/dL 29.56 (L)  Direct LDL mg/dL 21.3  NonHDL  086.57  Triglycerides 0.0 - 149.0 mg/dL 846.9 (H)  VLDL 0.0 - 62.9 mg/dL 52.8 (H)  (L): Data is abnormally low (H): Data is  abnormally high  ASSESSMENT / PLAN / RECOMMENDATIONS:   1) Type 2 Diabetes Mellitus, Optimally  controlled, With neuropathic complications - Most recent A1c of 7.0 %. Goal A1c < 7.0 %.    -A1c at goal -Patient endorses hypoglycemia, I have manually reviewed his Dexcom over the past month there was no episodes of hypoglycemia.  We discussed the definition of hypoglycemia with BG <70 mg/dL  -Trulicity/Ozempic caused severe diarrhea -Januvia cost prohibitive -Metformin stopped due to GI side effects.    MEDICATIONS:  Continue glipizide 5 MG DAILY  Continue Farxiga 10 mg 1 tablet daily  EDUCATION / INSTRUCTIONS: BG monitoring instructions: Patient is instructed to check his blood sugars 1 times a day, fasting. Call [facility] if: BG persistently < 70 I reviewed the Rule of 15 for the treatment of hypoglycemia in detail with the patient. Literature supplied.   2) Diabetic complications:  Eye: Does not have known diabetic retinopathy.  Neuro/ Feet: Does  have known diabetic peripheral neuropathy. Renal: Patient does not have known baseline CKD. He is on an ACEI/ARB at present    Follow-up in 6 months    Signed electronically by: Lyndle Herrlich, MD  [facility]  [facility] [address] [place] [place], [place] [address] Phone: [phone] FAX: [phone]   CC: Marisue [facility] [address] [place] [address] Phone: [phone]  Fax: [phone]    Return to Endocrinology clinic as below: Future Appointments  Date Time Provider Department Center  09/19/2023  8:30 AM Calleigh Lafontant, Konrad Dolores, MD [id number] None

## 2023-04-13 ENCOUNTER — Other Ambulatory Visit: Payer: Self-pay | Admitting: Medical

## 2023-04-13 ENCOUNTER — Other Ambulatory Visit: Payer: Self-pay | Admitting: Podiatry

## 2023-05-12 ENCOUNTER — Other Ambulatory Visit: Payer: Self-pay | Admitting: Medical

## 2023-05-12 ENCOUNTER — Other Ambulatory Visit: Payer: Self-pay | Admitting: Podiatry

## 2023-05-17 ENCOUNTER — Other Ambulatory Visit: Payer: Self-pay | Admitting: Medical

## 2023-05-19 ENCOUNTER — Telehealth: Payer: Self-pay

## 2023-05-19 ENCOUNTER — Other Ambulatory Visit (HOSPITAL_COMMUNITY): Payer: Self-pay

## 2023-05-19 NOTE — Telephone Encounter (Signed)
 Pharmacy Patient Advocate Encounter   Received notification from Pt Calls Messages that prior authorization for Dexcom G7 sensor is required/requested.   Insurance verification completed.   The patient is insured through [facility] .   Per test claim: Insulin use required.   I do not see that this pt is currently on any insulin. PA will be denied. Please advise.

## 2023-05-19 NOTE — Telephone Encounter (Signed)
 Dexcom needs PA

## 2023-05-21 ENCOUNTER — Encounter: Payer: Self-pay | Admitting: Medical

## 2023-05-21 MED ORDER — TAMSULOSIN HCL 0.4 MG PO CAPS: 0.4000 mg | ORAL_CAPSULE | Freq: Every day | ORAL | 3 refills | Status: AC

## 2023-05-21 NOTE — Addendum Note (Signed)
 Addended by: Gwenevere Abbot on: 05/21/2023 10:33 AM   Modules accepted: Orders

## 2023-06-21 ENCOUNTER — Other Ambulatory Visit: Payer: Self-pay | Admitting: Medical

## 2023-06-25 ENCOUNTER — Other Ambulatory Visit: Payer: Self-pay | Admitting: Medical

## 2023-08-31 ENCOUNTER — Other Ambulatory Visit: Payer: Self-pay | Admitting: Podiatry

## 2023-09-19 ENCOUNTER — Ambulatory Visit: Payer: 59 | Admitting: Internal Medicine

## 2023-09-24 IMAGING — CT CT RENAL STONE PROTOCOL
2 of 4 series · 16 of 46 positions shown, 18 images · non-contrast
Comparison: None.

CLINICAL DATA: Hematuria with left costovertebral angle tenderness.
History of renal stones.

EXAM:
CT ABDOMEN AND PELVIS WITHOUT CONTRAST
TECHNIQUE: Multidetector CT imaging of the abdomen and pelvis was performed
following the standard protocol without IV contrast.

[Series 2: axial st · axial · 0.94mm/px · z∈[-592,-82]mm · 13 of 112 slices shown, 15 images]
[im 5/112  soft-tissue]
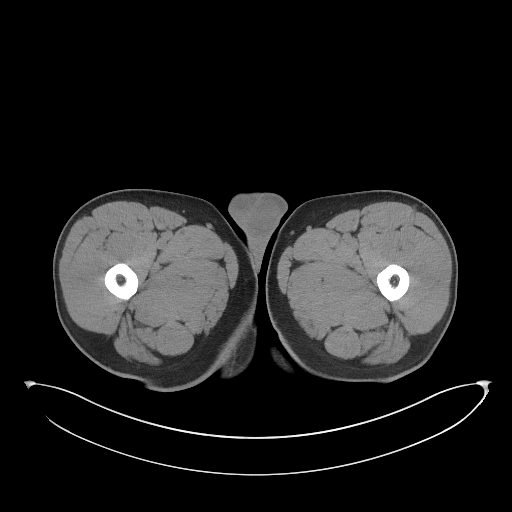
[im 5/112  bone]
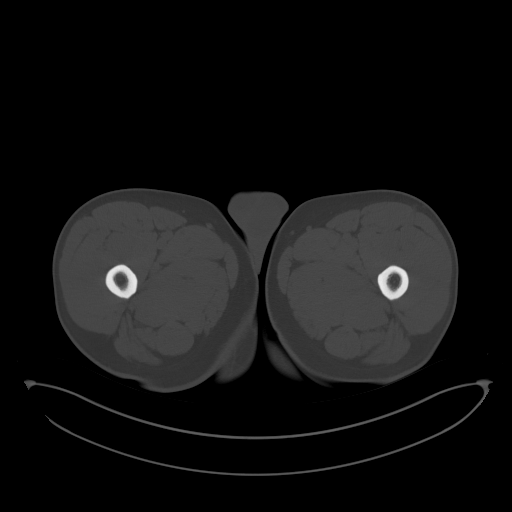
[im 14/112  soft-tissue]
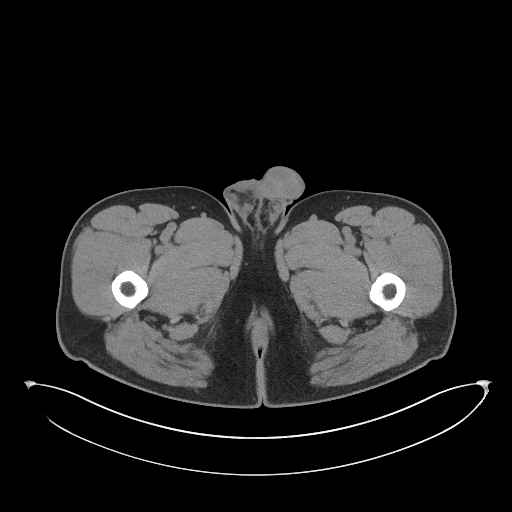
[im 24/112  soft-tissue]
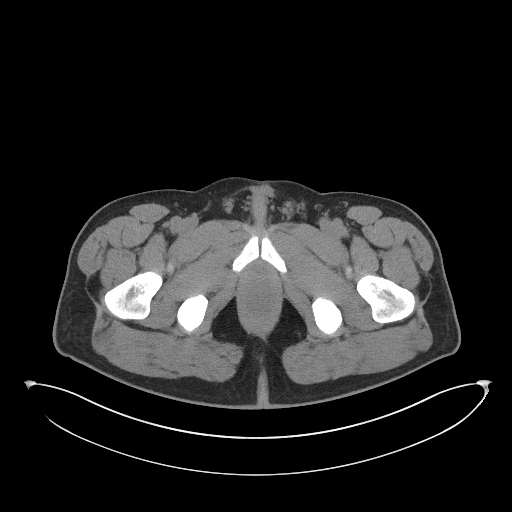
[im 33/112  soft-tissue]
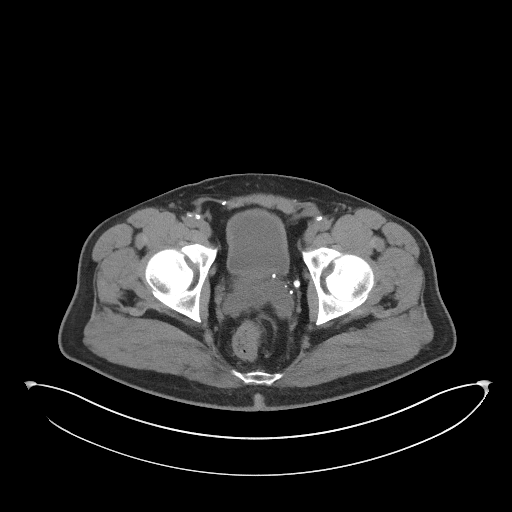
[im 38/112  soft-tissue]
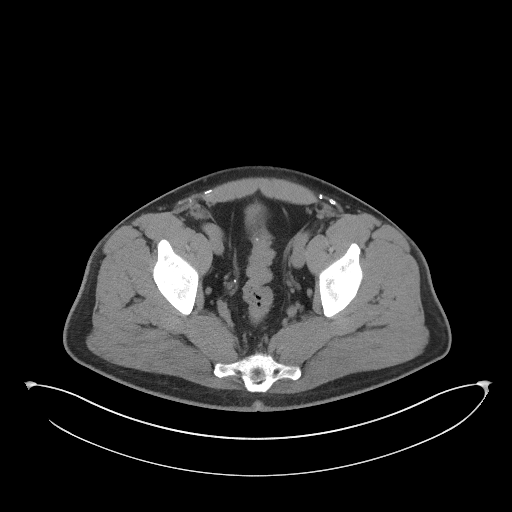
[im 47/112  soft-tissue]
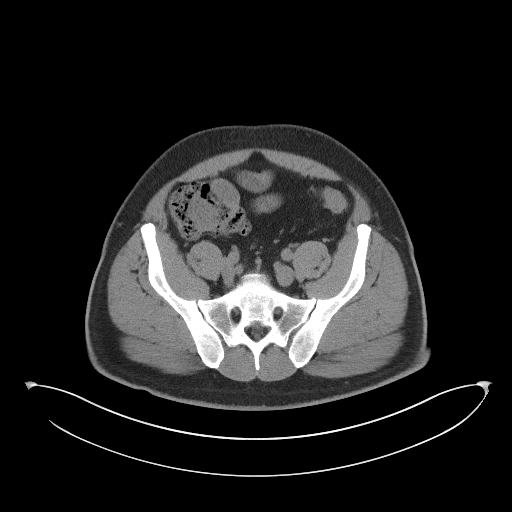
[im 56/112  soft-tissue]
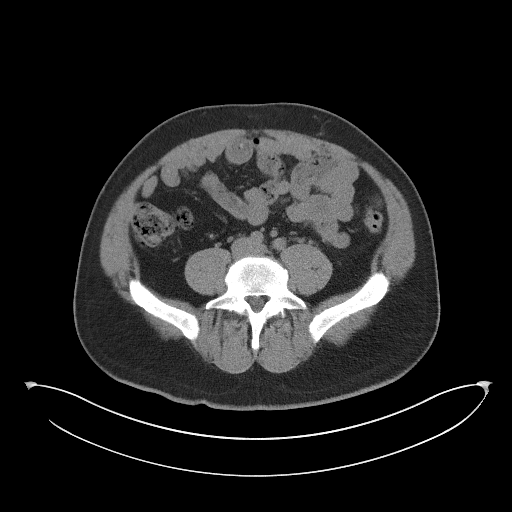
[im 65/112  soft-tissue]
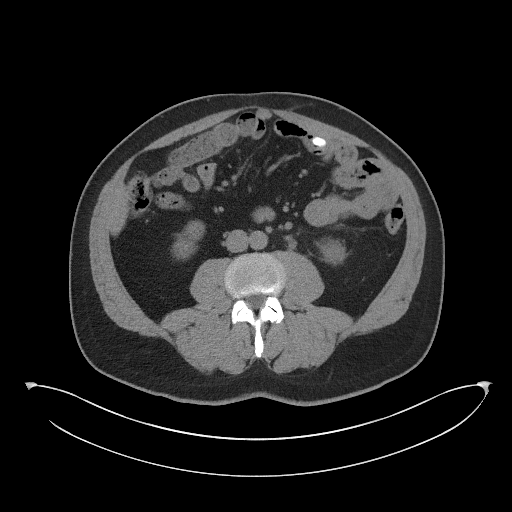
[im 75/112  soft-tissue]
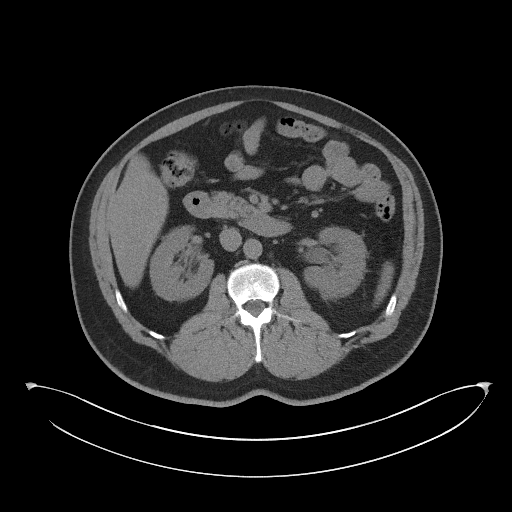
[im 75/112  bone]
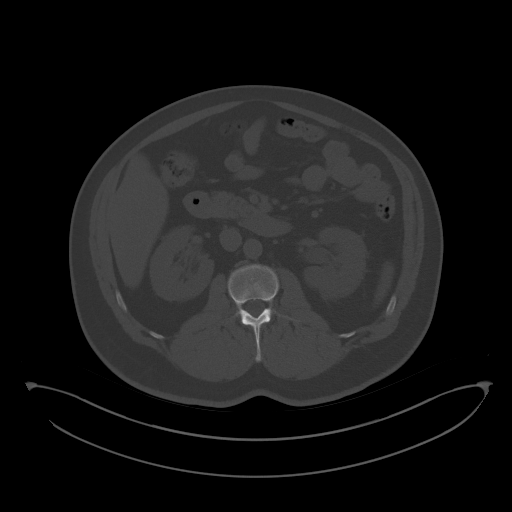
[im 79/112  soft-tissue]
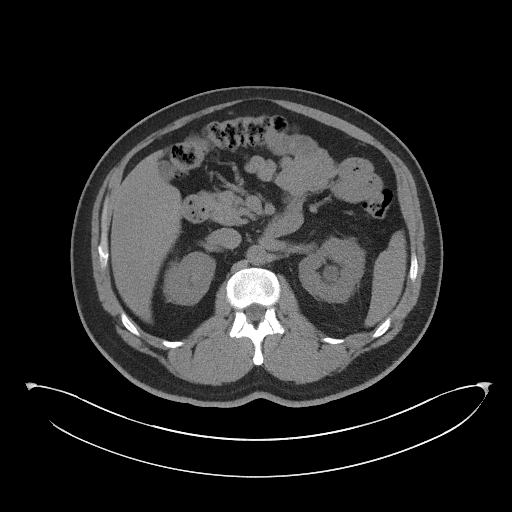
[im 88/112  soft-tissue]
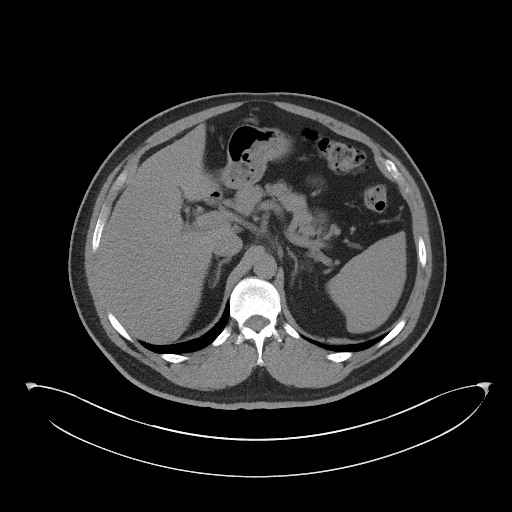
[im 98/112  soft-tissue]
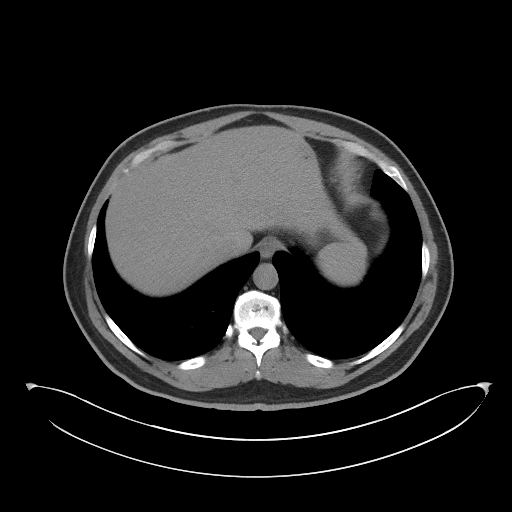
[im 107/112  soft-tissue]
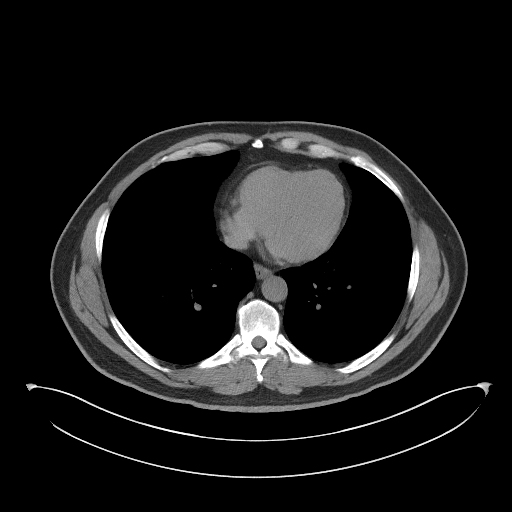

[Series 5: coronal st · coronal · 0.89mm/px · 3 of 102 slices shown]
[im 34/102  soft-tissue]
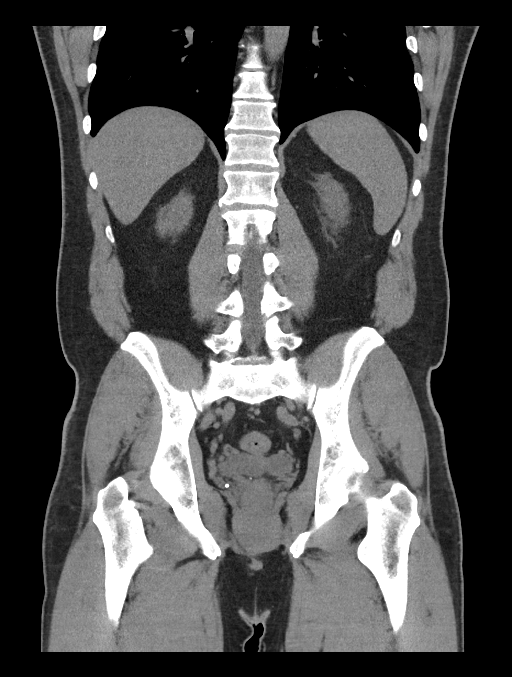
[im 45/102  soft-tissue]
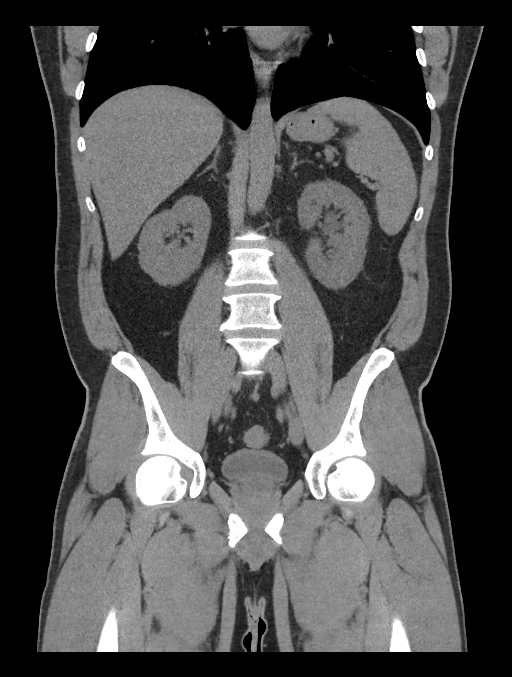
[im 57/102  soft-tissue]
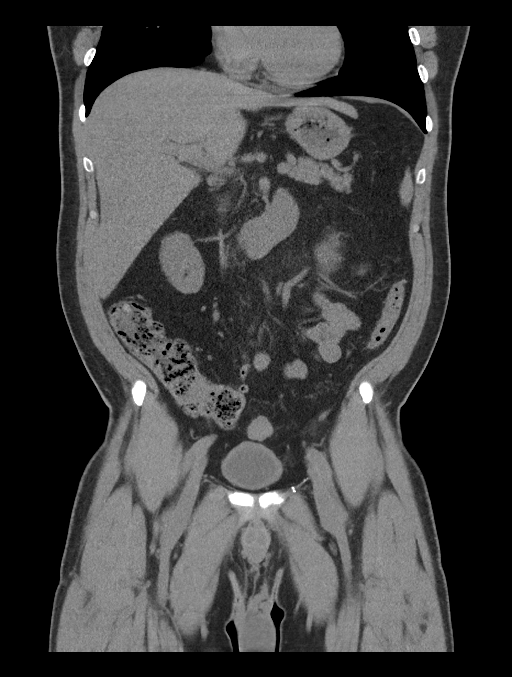

[16 of 46 positions shown; findings below may reference images not displayed]

FINDINGS: Lower chest: No acute abnormality.

Hepatobiliary: Unremarkable noncontrast appearance of the hepatic
parenchyma. Gallbladder is unremarkable. No biliary ductal dilation.

Pancreas: Within normal limits.

Spleen: Within normal limits.

Adrenals/Urinary Tract: Bilateral adrenal glands are unremarkable.

Left hydroureteronephrosis to the level of a 4 mm stone in the
proximal ureter at the level of the L3 vertebral body, with
perinephric and periureteric stranding.

Additional bilateral nonobstructive punctate nephrolithiasis
measuring up to 1-2 mm. Urinary bladder is unremarkable for degree
of distension. Pelvic phleboliths.

Stomach/Bowel: Stomach is within normal limits. Appendix appears
normal. No evidence of bowel wall thickening, distention, or
inflammatory changes.

Vascular/Lymphatic: No significant vascular findings are present. No
pathologically enlarged abdominal or pelvic lymph nodes.

Reproductive: Prostate is unremarkable.

Other: Prior bilateral inguinal hernia repair. No significant
abdominopelvic ascites.

Musculoskeletal: No acute or significant osseous findings.
IMPRESSION: 1. Left hydroureteronephrosis to the level of a 4 mm stone in the
proximal ureter at the level of the L3 vertebral body.
2. Additional bilateral nonobstructive punctate nephrolithiasis
measuring up to 1-2 mm.

## 2023-09-24 IMAGING — DX DG HIP (WITH OR WITHOUT PELVIS) 2-3V*R*
3 series · 3 of 3 positions shown · non-contrast
Comparison: No recent prior.

CLINICAL DATA: Right hip pain.  No recent injury.

EXAM:
DG HIP (WITH OR WITHOUT PELVIS) 2-3V RIGHT

[hip ap]
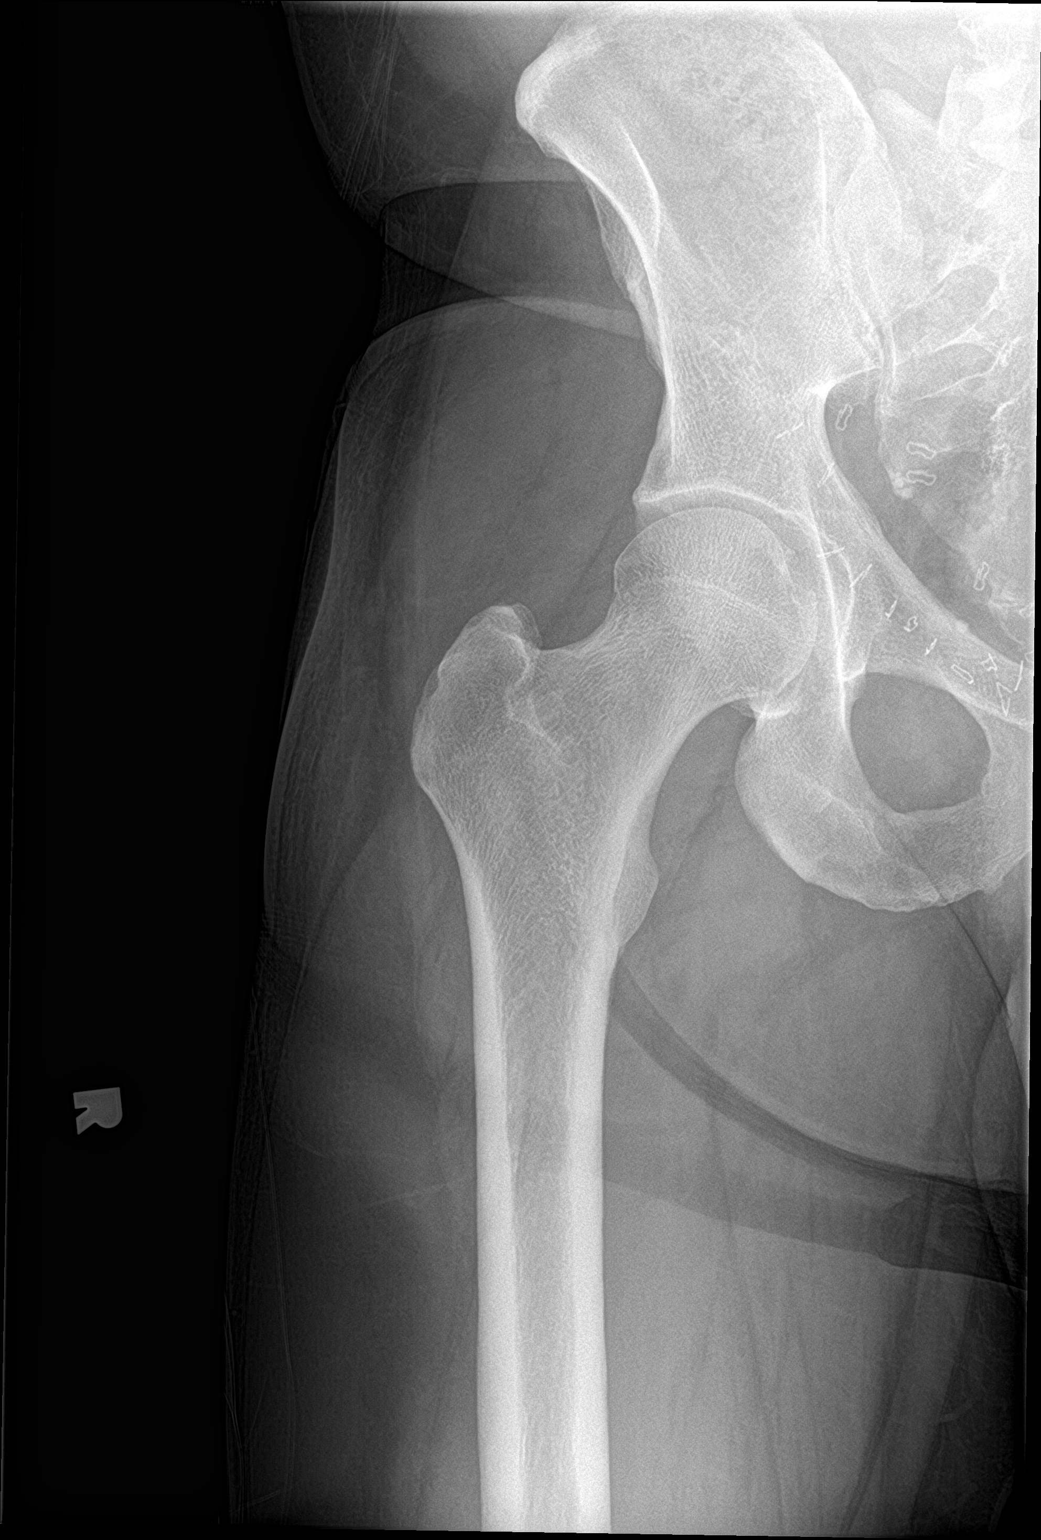

[hip lat]
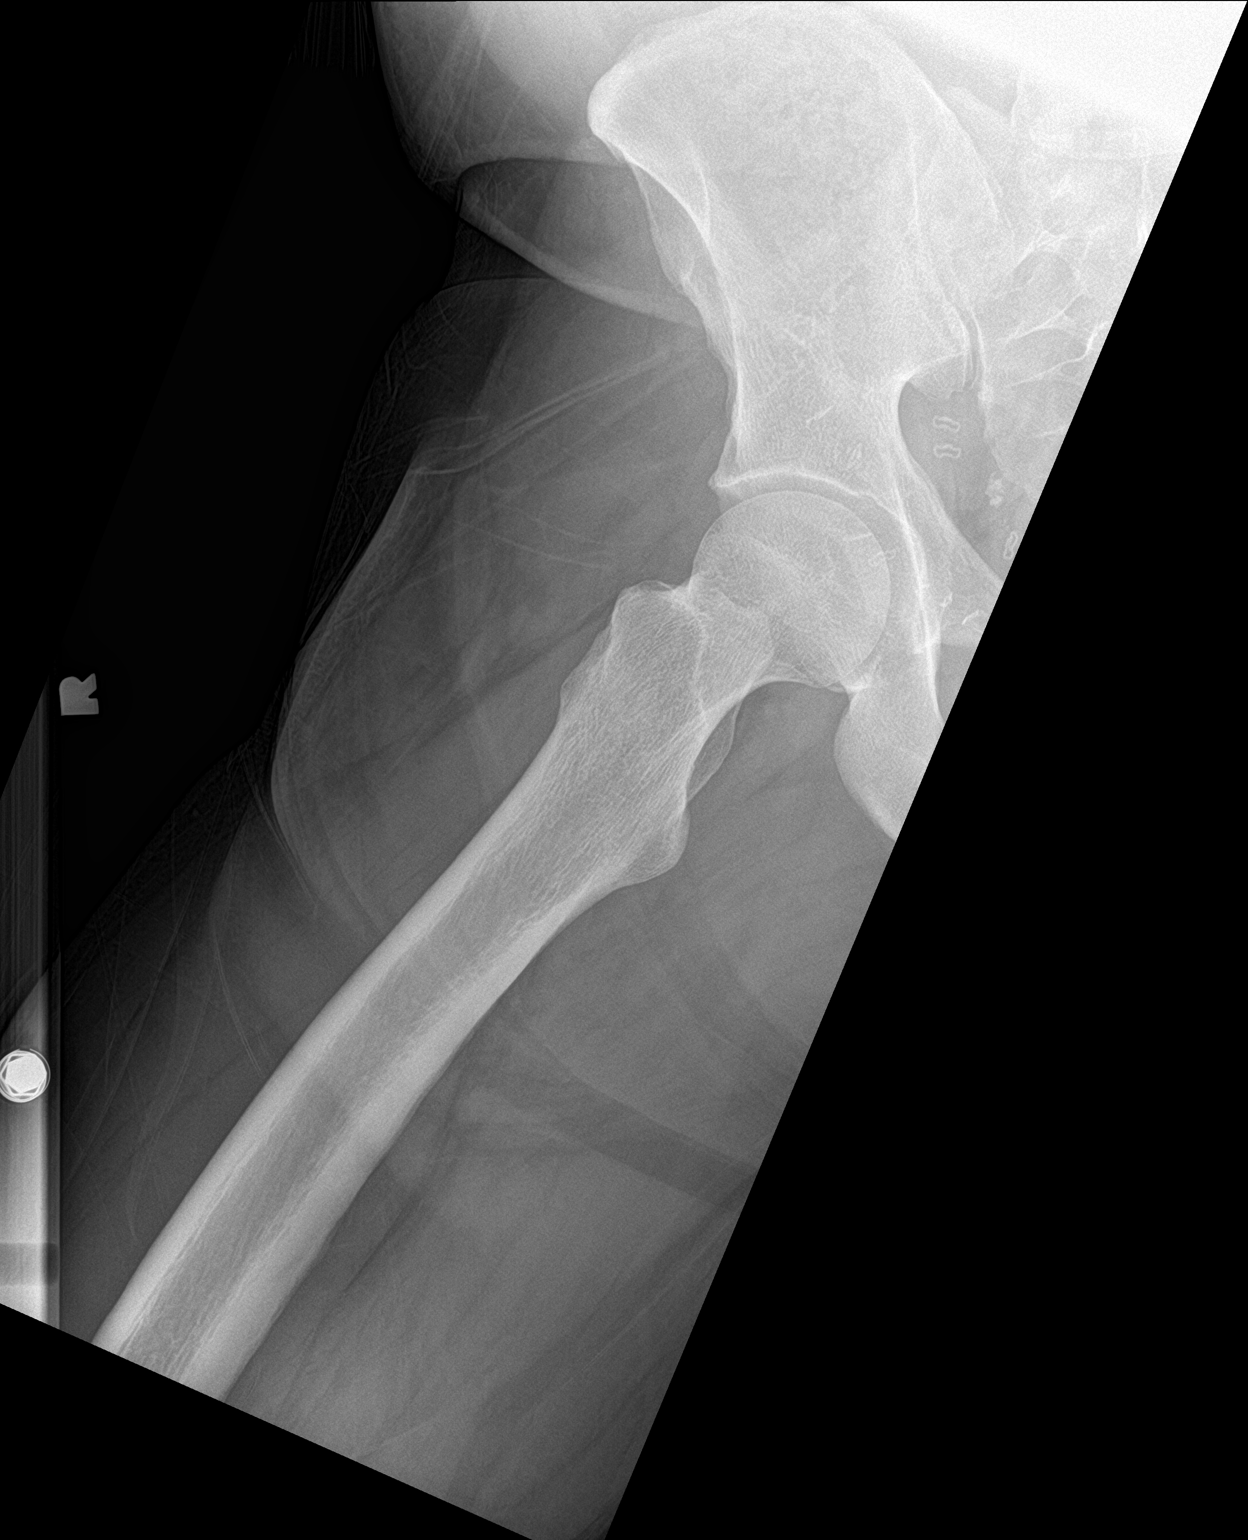

[pelvis ap]
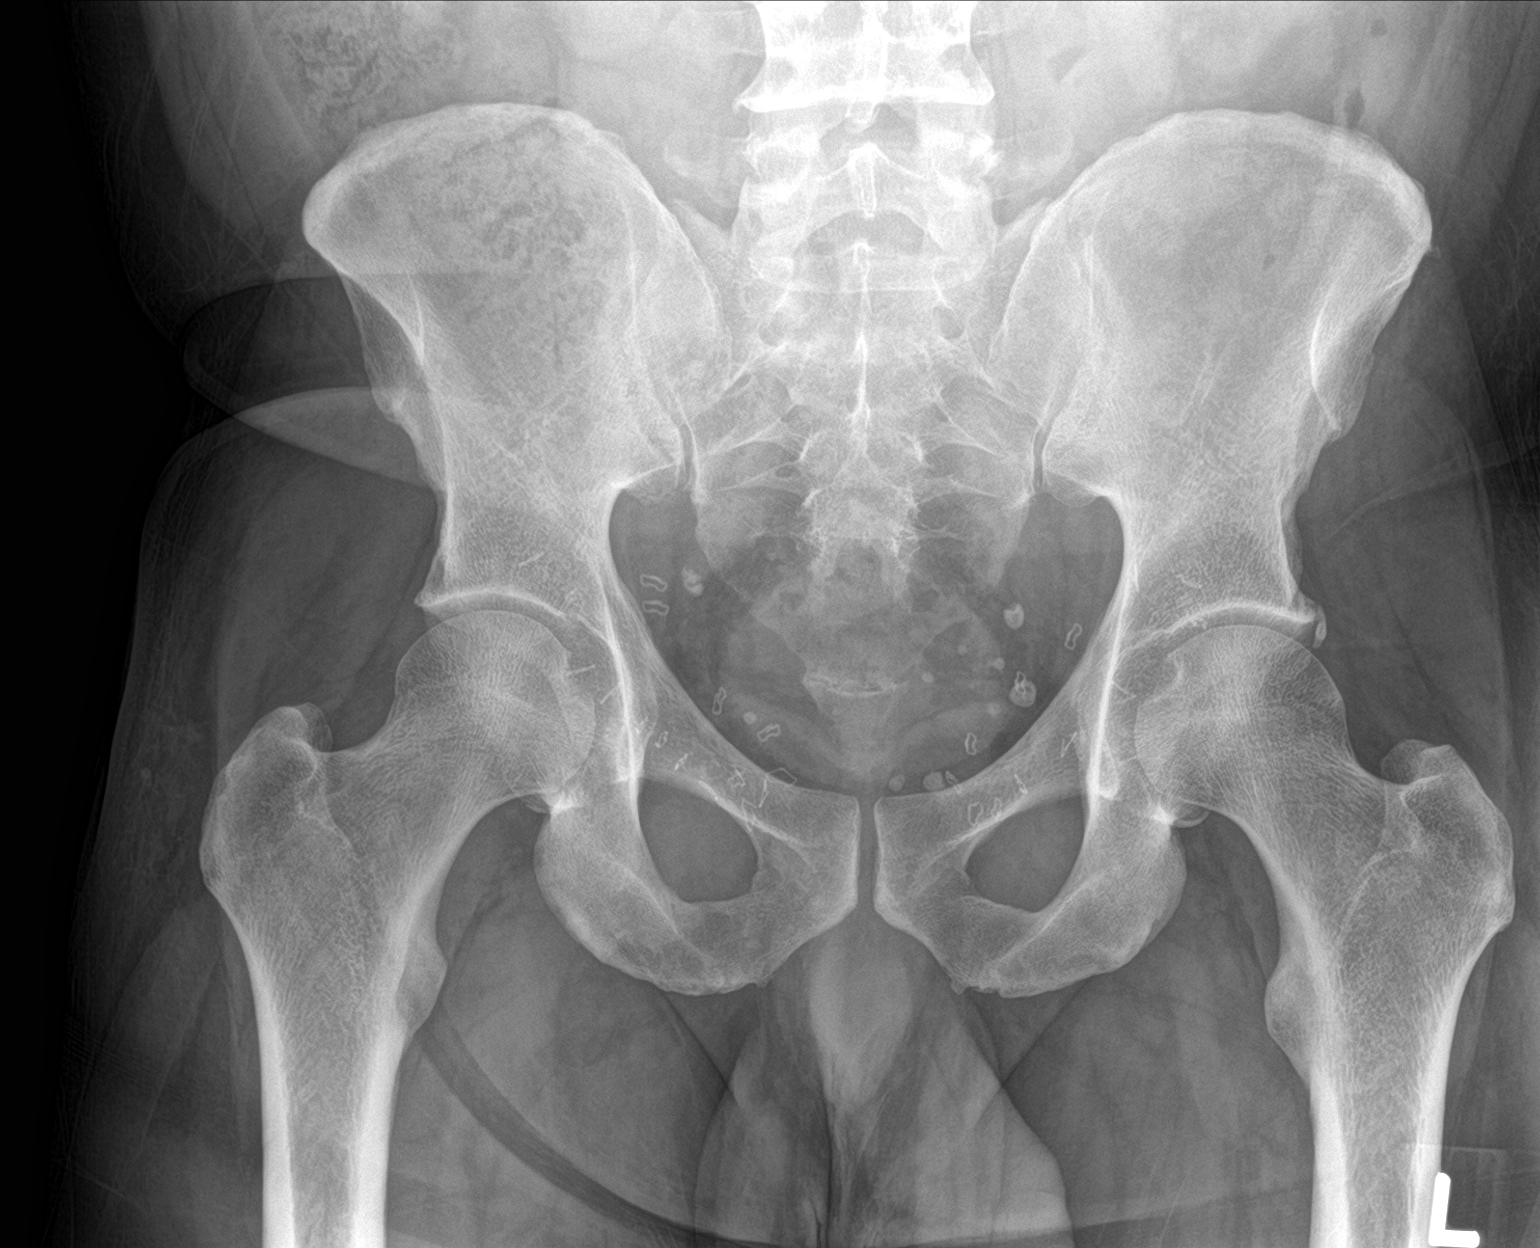

[3 of 3 positions shown; findings below may reference images not displayed]

FINDINGS: Surgical staples noted over the pelvis. Pelvic calcifications
consistent phleboliths mild degenerative changes both hips. No acute
bony or joint abnormality. No evidence of fracture dislocation.
Right hip is intact.
IMPRESSION: Mild degenerative changes both hips. No acute abnormality. Right hip
is intact.

## 2023-10-17 ENCOUNTER — Other Ambulatory Visit: Payer: Self-pay | Admitting: Cardiology

## 2023-12-07 ENCOUNTER — Other Ambulatory Visit: Payer: Self-pay | Admitting: Medical

## 2023-12-07 ENCOUNTER — Other Ambulatory Visit: Payer: Self-pay | Admitting: Cardiology

## 2023-12-27 ENCOUNTER — Other Ambulatory Visit: Payer: Self-pay | Admitting: Cardiology

## 2023-12-27 ENCOUNTER — Other Ambulatory Visit: Payer: Self-pay | Admitting: Medical

## 2024-04-09 ENCOUNTER — Other Ambulatory Visit: Payer: Self-pay | Admitting: Internal Medicine
